# Patient Record
Sex: Male | Born: 1950 | Race: Black or African American | Hispanic: No | Marital: Married | Smoking: Former smoker
Health system: Southern US, Community
[De-identification: ages and names within clinical notes are randomized; demographics above are authoritative.]

## PROBLEM LIST (undated history)

## (undated) DIAGNOSIS — C220 Liver cell carcinoma: Secondary | ICD-10-CM

## (undated) DIAGNOSIS — E119 Type 2 diabetes mellitus without complications: Secondary | ICD-10-CM

## (undated) DIAGNOSIS — D509 Iron deficiency anemia, unspecified: Secondary | ICD-10-CM

## (undated) DIAGNOSIS — I10 Essential (primary) hypertension: Secondary | ICD-10-CM

## (undated) DIAGNOSIS — E7800 Pure hypercholesterolemia, unspecified: Secondary | ICD-10-CM

## (undated) DIAGNOSIS — D352 Benign neoplasm of pituitary gland: Secondary | ICD-10-CM

## (undated) HISTORY — PX: OTHER SURGICAL HISTORY: SHX169

---

## 2020-02-01 ENCOUNTER — Emergency Department
Admission: EM | Admit: 2020-02-01 | Discharge: 2020-02-01 | Disposition: A | Discharge status: Home / Self Care | Payer: Medicare Other | Attending: Emergency Medicine | Admitting: Emergency Medicine

## 2020-02-01 ENCOUNTER — Encounter: Payer: Self-pay | Admitting: Emergency Medicine

## 2020-02-01 ENCOUNTER — Other Ambulatory Visit: Payer: Self-pay

## 2020-02-01 DIAGNOSIS — Z111 Encounter for screening for respiratory tuberculosis: Secondary | ICD-10-CM | POA: Diagnosis not present

## 2020-02-01 NOTE — Discharge Instructions (Addendum)
PPD is negative F/u with your doctor as needed

## 2020-02-01 NOTE — ED Triage Notes (Signed)
Pt to ED via POV with c/o needing PPD read, states had PPD placed at [facility] acute care several days, unable to get it read at [facility] today.

## 2020-02-01 NOTE — ED Provider Notes (Signed)
[facility] Emergency Department Provider Note  ____________________________________________   Event Date/Time   First MD Initiated Contact with Patient 02/01/20 1214     (approximate)  I have reviewed the triage vital signs and the nursing notes.   HISTORY  Chief Complaint PPD Reading    HPI Chamberlain Steinborn is a 70 y.o. male presents emergency department in need of having his PPD read.  Patient states he had a PPD test done at [facility] clinic several days ago and was supposed to return today to have it read but they are closed due to the weather.    History reviewed. No pertinent past medical history.  There are no problems to display for this patient.   History reviewed. No pertinent surgical history.  Prior to Admission medications   Not on File    Allergies Patient has no known allergies.  No family history on file.  Social History Social History   Tobacco Use  . Smoking status: Never Smoker  . Smokeless tobacco: Never Used  Substance Use Topics  . Alcohol use: Not Currently  . Drug use: Not Currently    Review of Systems  Constitutional: No fever/chills Eyes: No visual changes. ENT: No sore throat. Respiratory: Denies cough Genitourinary: Negative for dysuria. Musculoskeletal: Negative for back pain. Skin: Negative for rash. Psychiatric: no mood changes,     ____________________________________________   PHYSICAL EXAM:  VITAL SIGNS: ED Triage Vitals  Enc Vitals Group     BP 02/01/20 1215 107/65     Pulse Rate 02/01/20 1215 78     Resp 02/01/20 1215 20     Temp 02/01/20 1215 98.4 F (36.9 C)     Temp Source 02/01/20 1215 Oral     SpO2 02/01/20 1215 98 %     Weight 02/01/20 1204 175 lb (79.4 kg)     Height 02/01/20 1204 5\' 7"  (1.702 m)     Head Circumference --      Peak Flow --      Pain Score 02/01/20 1204 0     Pain Loc --      Pain Edu? --      Excl. in GC? --     Constitutional: Alert and oriented.  Well appearing and in no acute distress. Eyes: Conjunctivae are normal.  Head: Atraumatic. Nose: No congestion/rhinnorhea. Mouth/Throat: Mucous membranes are moist.  Neck:  supple no lymphadenopathy noted Cardiovascular: Normal rate, regular rhythm.  Respiratory: Normal respiratory effort.  No retractions, GU: deferred Musculoskeletal: FROM all extremities, warm and well perfused Neurologic:  Normal speech and language.  Skin:  Skin is warm, dry and intact. No rash noted.  Area for PPD was placed is normal, no raised area and no induration noted Psychiatric: Mood and affect are normal. Speech and behavior are normal.  ____________________________________________   LABS (all labs ordered are listed, but only abnormal results are displayed)  Labs Reviewed - No data to display ____________________________________________   ____________________________________________  RADIOLOGY    ____________________________________________   PROCEDURES  Procedure(s) performed: No  Procedures    ____________________________________________   INITIAL IMPRESSION / ASSESSMENT AND PLAN / ED COURSE  Pertinent labs & imaging results that were available during my care of the patient were reviewed by me and considered in my medical decision making (see chart for details).   Patient 70 year old male presents emergency department in need of having his PPD read.  See HPI.  Physical exam is unremarkable.  PPD area is normal  Patient was  discharged stable condition with instructions to follow-up with his regular doctor.     Romelo Sciandra was evaluated in Emergency Department on 02/01/2020 for the symptoms described in the history of present illness. He was evaluated in the context of the global COVID-19 pandemic, which necessitated consideration that the patient might be at risk for infection with the SARS-CoV-2 virus that causes COVID-19. Institutional protocols and algorithms that pertain to  the evaluation of patients at risk for COVID-19 are in a state of rapid change based on information released by regulatory bodies including the CDC and federal and state organizations. These policies and algorithms were followed during the patient's care in the ED.    As part of my medical decision making, I reviewed the following data within the electronic MEDICAL RECORD NUMBER Nursing notes reviewed and incorporated, Old chart reviewed, Notes from prior ED visits and [place] Controlled Substance Database  ____________________________________________   FINAL CLINICAL IMPRESSION(S) / ED DIAGNOSES  Final diagnoses:  Encounter for PPD skin test reading      NEW MEDICATIONS STARTED DURING THIS VISIT:  There are no discharge medications for this patient.    Note:  This document was prepared using Dragon voice recognition software and may include unintentional dictation errors.    Faythe Ghee, PA-C 02/01/20 1255    Dionne Bucy, MD 02/01/20 1321

## 2020-09-02 ENCOUNTER — Other Ambulatory Visit: Payer: Self-pay

## 2020-09-02 ENCOUNTER — Emergency Department
Admission: EM | Admit: 2020-09-02 | Discharge: 2020-09-02 | Disposition: A | Discharge status: Home / Self Care | Payer: Medicare Other | Attending: Emergency Medicine | Admitting: Emergency Medicine

## 2020-09-02 ENCOUNTER — Encounter: Payer: Self-pay | Admitting: Emergency Medicine

## 2020-09-02 DIAGNOSIS — J069 Acute upper respiratory infection, unspecified: Secondary | ICD-10-CM | POA: Diagnosis not present

## 2020-09-02 DIAGNOSIS — U071 COVID-19: Secondary | ICD-10-CM | POA: Diagnosis not present

## 2020-09-02 DIAGNOSIS — I10 Essential (primary) hypertension: Secondary | ICD-10-CM | POA: Insufficient documentation

## 2020-09-02 DIAGNOSIS — R509 Fever, unspecified: Secondary | ICD-10-CM | POA: Diagnosis present

## 2020-09-02 HISTORY — DX: Essential (primary) hypertension: I10

## 2020-09-02 HISTORY — DX: Pure hypercholesterolemia, unspecified: E78.00

## 2020-09-02 LAB — RESP PANEL BY RT-PCR (FLU A&B, COVID) ARPGX2
Influenza A by PCR: NEGATIVE
Influenza B by PCR: NEGATIVE
SARS Coronavirus 2 by RT PCR: POSITIVE — AB

## 2020-09-02 MED ORDER — PREDNISONE 10 MG (21) PO TBPK: ORAL_TABLET | ORAL | 0 refills | Status: DC

## 2020-09-02 NOTE — ED Notes (Signed)
See triage note  Presents with fatigue  States he has felt this way for about 2-3 weeks    States he was exposed to someone with COVID at work  Afebrile on arrival  No pain but states has had diarrhea

## 2020-09-02 NOTE — Discharge Instructions (Addendum)
Follow your pending test results with [facility] My[facility].  Take prescription steroid as directed.  Take over-the-counter medication as needed for ongoing symptom relief.  Follow-up with your primary provider [facility] for continued symptoms.

## 2020-09-02 NOTE — ED Provider Notes (Signed)
[facility] Emergency Department Provider Note ____________________________________________  Time seen: 1229  I have reviewed the triage vital signs and the nursing notes.  HISTORY  Chief Complaint  Fatigue   HPI Aaron Lowery is a 70 y.o. male presents himself to the ED for evaluation of intermittent symptoms of fatigue, objective fevers, cough, congestion, diarrhea, loss of appetite, with onset 3 weeks prior.  Patient reports being Covid Vax + but no boosters. Flu vax + not presented to his primary provider or any other healthcare provider in the last 3 weeks for symptom evaluation.  Denies any chest pain, shortness of breath, loss of taste, or abdominal pain.  Past Medical History:  Diagnosis Date   Hypercholesteremia    Hypertension     There are no problems to display for this patient.   History reviewed. No pertinent surgical history.  Prior to Admission medications   Medication Sig Start Date End Date Taking? Authorizing Provider  predniSONE (STERAPRED UNI-PAK 21 TAB) 10 MG (21) TBPK tablet 6-day taper as directed. 09/02/20  Yes Jaliyah Fotheringham, Charlesetta Ivory, PA-C    Allergies Patient has no known allergies.  History reviewed. No pertinent family history.  Social History Social History   Tobacco Use   Smoking status: Never   Smokeless tobacco: Never  Substance Use Topics   Alcohol use: Not Currently   Drug use: Not Currently    Review of Systems  Constitutional: Negative for fever.  Reports generalized fatigue. Eyes: Negative for visual changes. ENT: Negative for sore throat. Cardiovascular: Negative for chest pain. Respiratory: Negative for shortness of breath. Gastrointestinal: Negative for abdominal pain, vomiting and diarrhea. Genitourinary: Negative for dysuria. Musculoskeletal: Negative for back pain. Skin: Negative for rash. Neurological: Negative for headaches, focal weakness or  numbness. ____________________________________________  PHYSICAL EXAM:  VITAL SIGNS: ED Triage Vitals  Enc Vitals Group     BP 09/02/20 1130 102/73     Pulse Rate 09/02/20 1130 78     Resp 09/02/20 1130 16     Temp 09/02/20 1130 98.6 F (37 C)     Temp Source 09/02/20 1130 Oral     SpO2 09/02/20 1130 95 %     Weight 09/02/20 1130 175 lb 0.7 oz (79.4 kg)     Height 09/02/20 1130 5\' 7"  (1.702 m)     Head Circumference --      Peak Flow --      Pain Score 09/02/20 1135 6     Pain Loc --      Pain Edu? --      Excl. in GC? --     Constitutional: Alert and oriented. Well appearing and in no distress. Head: Normocephalic and atraumatic. Eyes: Conjunctivae are normal. Normal extraocular movements Cardiovascular: Normal rate, regular rhythm. Normal distal pulses. Respiratory: Normal respiratory effort. No wheezes/rales/rhonchi. Gastrointestinal: Soft and nontender. No distention. Musculoskeletal: Nontender with normal range of motion in all extremities.  Neurologic:  Normal gait without ataxia. Normal speech and language. No gross focal neurologic deficits are appreciated. Skin:  Skin is warm, dry and intact. No rash noted. Psychiatric: Mood and affect are normal. Patient exhibits appropriate insight and judgment. ____________________________________________    {LABS (pertinent positives/negatives) Labs Reviewed  RESP PANEL BY RT-PCR (FLU A&B, COVID) ARPGX2  ____________________________________________  {EKG  ____________________________________________   RADIOLOGY Official radiology report(s): No results found. ____________________________________________  PROCEDURES   Procedures ____________________________________________   INITIAL IMPRESSION / ASSESSMENT AND PLAN / ED COURSE  As part of my medical decision making,  I reviewed the following data within the electronic MEDICAL RECORD NUMBER Labs reviewed pending and Notes from prior ED visits    DDX: viral URI,  Covid, influenza  Patient ED evaluation of symptoms including 3 weeks of intermittent fatigue, subjective fevers, congestion, and poor appetite.  Evaluate was complaints in the ED, found to be overall with reassuring stable vital signs without signs of acute respiratory distress or dehydration.  Patient is without toxic appearance on presentation.  He is agreeable to await COVID and flu testing results at this time.  He will follow with primary provider for ongoing symptoms.  The prescription for prednisone provided for his benefit.  He will follow-up as discussed  Aaron Lowery was evaluated in Emergency Department on 09/02/2020 for the symptoms described in the history of present illness. He was evaluated in the context of the global COVID-19 pandemic, which necessitated consideration that the patient might be at risk for infection with the SARS-CoV-2 virus that causes COVID-19. Institutional protocols and algorithms that pertain to the evaluation of patients at risk for COVID-19 are in a state of rapid change based on information released by regulatory bodies including the CDC and federal and state organizations. These policies and algorithms were followed during the patient's care in the ED. ____________________________________________  FINAL CLINICAL IMPRESSION(S) / ED DIAGNOSES  Final diagnoses:  Viral URI      Lissa Hoard, PA-C 09/02/20 1411    Arnaldo Natal, MD 09/02/20 [phone]

## 2020-09-02 NOTE — ED Triage Notes (Signed)
Pt reports that he has been feeling tired, he has been laying in bed and has only been eating breakfast. He has to force himself to get up. VSS. NAD. Pt is able to ambulate without difficulty. He reports that his job sent him to a place where someone had COVID and he wants to sue his employer because they didn't tell him and ever since than he has been feeling tired.

## 2021-02-24 ENCOUNTER — Emergency Department: Payer: Medicare Other

## 2021-02-24 ENCOUNTER — Emergency Department
Admission: EM | Admit: 2021-02-24 | Discharge: 2021-02-24 | Disposition: A | Discharge status: Home / Self Care | Payer: Medicare Other | Attending: Emergency Medicine | Admitting: Emergency Medicine

## 2021-02-24 ENCOUNTER — Other Ambulatory Visit: Payer: Self-pay

## 2021-02-24 ENCOUNTER — Encounter: Payer: Self-pay | Admitting: Emergency Medicine

## 2021-02-24 DIAGNOSIS — I10 Essential (primary) hypertension: Secondary | ICD-10-CM | POA: Diagnosis not present

## 2021-02-24 DIAGNOSIS — M79671 Pain in right foot: Secondary | ICD-10-CM | POA: Insufficient documentation

## 2021-02-24 DIAGNOSIS — R0602 Shortness of breath: Secondary | ICD-10-CM | POA: Insufficient documentation

## 2021-02-24 DIAGNOSIS — R5383 Other fatigue: Secondary | ICD-10-CM | POA: Insufficient documentation

## 2021-02-24 DIAGNOSIS — R531 Weakness: Secondary | ICD-10-CM | POA: Diagnosis not present

## 2021-02-24 DIAGNOSIS — M79672 Pain in left foot: Secondary | ICD-10-CM | POA: Diagnosis not present

## 2021-02-24 DIAGNOSIS — Z20822 Contact with and (suspected) exposure to covid-19: Secondary | ICD-10-CM | POA: Insufficient documentation

## 2021-02-24 LAB — BASIC METABOLIC PANEL
Anion gap: 5 (ref 5–15)
BUN: 19 mg/dL (ref 8–23)
CO2: 27 mmol/L (ref 22–32)
Calcium: 8.9 mg/dL (ref 8.9–10.3)
Chloride: 104 mmol/L (ref 98–111)
Creatinine, Ser: 1.21 mg/dL (ref 0.61–1.24)
GFR, Estimated: >60 mL/min (ref >60)
Glucose, Bld: 93 mg/dL (ref 70–99)
Potassium: 4.1 mmol/L (ref 3.5–5.1)
Sodium: 136 mmol/L (ref 135–145)

## 2021-02-24 LAB — RESP PANEL BY RT-PCR (FLU A&B, COVID) ARPGX2
Influenza A by PCR: NEGATIVE
Influenza B by PCR: NEGATIVE
SARS Coronavirus 2 by RT PCR: NEGATIVE

## 2021-02-24 LAB — HEPATIC FUNCTION PANEL
ALT: 16 U/L (ref 0–44)
AST: 20 U/L (ref 15–41)
Albumin: 3.9 g/dL (ref 3.5–5.0)
Alkaline Phosphatase: 41 U/L (ref 38–126)
Bilirubin, Direct: <0.1 mg/dL (ref 0.0–0.2)
Total Bilirubin: 0.5 mg/dL (ref 0.3–1.2)
Total Protein: 6.5 g/dL (ref 6.5–8.1)

## 2021-02-24 LAB — CBC
HCT: 35.9 % — ABNORMAL LOW (ref 39.0–52.0)
Hemoglobin: 11.3 g/dL — ABNORMAL LOW (ref 13.0–17.0)
MCH: 22.4 pg — ABNORMAL LOW (ref 26.0–34.0)
MCHC: 31.5 g/dL (ref 30.0–36.0)
MCV: 71.2 fL — ABNORMAL LOW (ref 80.0–100.0)
Platelets: 310 10*3/uL (ref 150–400)
RBC: 5.04 MIL/uL (ref 4.22–5.81)
RDW: 14.6 % (ref 11.5–15.5)
WBC: 7.4 10*3/uL (ref 4.0–10.5)
nRBC: 0 % (ref 0.0–0.2)

## 2021-02-24 LAB — TROPONIN I (HIGH SENSITIVITY)
Troponin I (High Sensitivity): 8 ng/L (ref <18)
Troponin I (High Sensitivity): 7 ng/L (ref <18)

## 2021-02-24 LAB — LIPASE, BLOOD: Lipase: 38 U/L (ref 11–51)

## 2021-02-24 LAB — D-DIMER, QUANTITATIVE: D-Dimer, Quant: 0.36 ug/mL-FEU (ref 0.00–0.50)

## 2021-02-24 NOTE — ED Notes (Signed)
Pt called for vital sign rechecked. No answer. Pt not seen in the ED.

## 2021-02-24 NOTE — ED Provider Notes (Signed)
[facility] Provider Note    Event Date/Time   First MD Initiated Contact with Patient 02/24/21 1917     (approximate)   History   Shortness of Breath, Weakness, and Foot Pain   HPI  Nicky Kras is a 71 y.o. male past medical history of hypertension hyperlipidemia presents with generalized malaise and shortness of breath.  Symptoms been going on for the past 3 to 4 days.  Endorses feeling very fatigued especially after eating.  Also endorses dyspnea on exertion which is new for him.  Has not had cough congestion fevers or chills.  Denies nausea vomiting abdominal pain.  Denies chest pain.  Has had some bilateral ankle pain but no swelling.  The patient denies hx of prior DVT/PE, unilateral leg pain/swelling, hormone use, recent surgery, hx of cancer, prolonged immobilization, or hemoptysis.      Past Medical History:  Diagnosis Date   Hypercholesteremia    Hypertension     There are no problems to display for this patient.    Physical Exam  Triage Vital Signs: ED Triage Vitals  Enc Vitals Group     BP 02/24/21 1704 (!) 162/123     Pulse Rate 02/24/21 1704 (!) 54     Resp 02/24/21 1704 20     Temp 02/24/21 1704 98.6 F (37 C)     Temp Source 02/24/21 1704 Oral     SpO2 02/24/21 1704 98 %     Weight 02/24/21 1706 179 lb 14.3 oz (81.6 kg)     Height 02/24/21 1706 5\' 7"  (1.702 m)     Head Circumference --      Peak Flow --      Pain Score 02/24/21 1704 7     Pain Loc --      Pain Edu? --      Excl. in GC? --     Most recent vital signs: Vitals:   02/24/21 1704 02/24/21 1911  BP: (!) 162/123 (!) 142/87  Pulse: (!) 54 64  Resp: 20 17  Temp: 98.6 F (37 C) 98.6 F (37 C)  SpO2: 98% 98%     General: Awake, no distress.  CV:  Good peripheral perfusion.  No asymmetry or lower extremity edema Resp:  Normal effort.  Lungs are clear Abd:  No distention.  Soft and nontender throughout Neuro:             Awake, Alert, Oriented x 3   Other:     ED Results / Procedures / Treatments  Labs (all labs ordered are listed, but only abnormal results are displayed) Labs Reviewed  CBC - Abnormal; Notable for the following components:      Result Value   Hemoglobin 11.3 (*)    HCT 35.9 (*)    MCV 71.2 (*)    MCH 22.4 (*)    All other components within normal limits  RESP PANEL BY RT-PCR (FLU A&B, COVID) ARPGX2  BASIC METABOLIC PANEL  HEPATIC FUNCTION PANEL  LIPASE, BLOOD  D-DIMER, QUANTITATIVE  TROPONIN I (HIGH SENSITIVITY)  TROPONIN I (HIGH SENSITIVITY)     EKG  EKG interpreted by myself, LVH, left axis deviation, sinus bradycardia, no acute ischemic changes   RADIOLOGY I reviewed the CXR which does not show any acute cardiopulmonary process; agree with radiology report     PROCEDURES:    MEDICATIONS ORDERED IN ED: Medications - No data to display   IMPRESSION / MDM / ASSESSMENT AND PLAN / ED COURSE  I reviewed the triage vital signs and the nursing notes.                              Differential diagnosis includes, but is not limited to, pneumonia, viral illness, CHF, pulmonary embolism, symptomatic anemia  Is a 71 year old male presenting with shortness of breath and generalized fatigue x4 days.  Vital signs are notable for borderline hypoxia, sat is between 93 and 98% on room air.  He otherwise appears well he does not appear volume overloaded lungs are clear abdomen is benign.  Chest x-ray is normal and his EKG aside showing LVH and a left axis is no acute ischemic changes.  Labs are reassuring CBC normal no anemia or white count CMP with normal electrolytes and troponin is negative.  Given the unexplained dyspnea with clear chest x-ray and borderline sats we will send a D-dimer to screen for PE.  Constellation of symptoms are somewhat suggestive of a viral illness we will send COVID and flu test as well.  Repeat troponin is negative as is D-dimer.  No obvious source of patient's dyspnea  identified.  I advised him to follow-up with his PCP if he has ongoing symptoms.  He is stable for discharge.  Clinical Course as of 02/24/21 2212  Tue Feb 24, 2021  2047 D-Dimer, Quant: 0.36 [KM]  2047 Troponin I (High Sensitivity): 7 [KM]    Clinical Course User Index [KM] Georga Hacking, MD     FINAL CLINICAL IMPRESSION(S) / ED DIAGNOSES   Final diagnoses:  Other fatigue  Shortness of breath     Rx / DC Orders   ED Discharge Orders     None        Note:  This document was prepared using Dragon voice recognition software and may include unintentional dictation errors.   Georga Hacking, MD 02/24/21 2212

## 2021-02-24 NOTE — ED Notes (Signed)
E signature pad not working. Pt educated on discharge instructions and verbalized understanding.  

## 2021-02-24 NOTE — ED Provider Triage Note (Signed)
Emergency Medicine Provider Triage Evaluation Note  Aaron Lowery , a 71 y.o. male  was evaluated in triage.  presents to the emergency department with shortness of breath for the past 3 weeks.  Patient denies chest pain or chest tightness.  No pain in the upper back.  No cough.  Endorses generalized malaise at home and increased sleeping.  Denies experiencing similar symptoms in the past.  Review of Systems  Positive: Patient has shortness of breath. Negative:   Physical Exam  BP (!) 162/123 (BP Location: Right Arm)    Pulse (!) 54    Temp 98.6 F (37 C) (Oral)    Resp 20    Ht 5\' 7"  (1.702 m)    Wt 81.6 kg    SpO2 98%    BMI 28.18 kg/m  Gen:   Awake, no distress   Resp:  Normal effort  MSK:   Moves extremities without difficulty  Other:    Medical Decision Making  Medically screening exam initiated at 5:14 PM.  Appropriate orders placed.  Easten Maceachern was informed that the remainder of the evaluation will be completed by another provider, this initial triage assessment does not replace that evaluation, and the importance of remaining in the ED until their evaluation is complete.     Linton Rump [place], [place] 02/24/21 1714

## 2021-02-24 NOTE — ED Triage Notes (Signed)
Pt reports that he developed SHOB, weakness and bilat foot pain three days ago. He is able to speak in complete sentences. He feels like all he wants to do is rest and sleep and this is not normal for him.

## 2021-02-24 NOTE — Discharge Instructions (Signed)
Your blood work including your cardiac enzymes and the screen for blood clots was all reassuring.  Your chest x-ray does not show pneumonia.  You may have a viral infection was causing your symptoms.  Please follow-up with your primary care physician if you continue to have symptoms.

## 2021-09-09 ENCOUNTER — Other Ambulatory Visit: Payer: Self-pay

## 2021-09-09 ENCOUNTER — Emergency Department
Admission: EM | Admit: 2021-09-09 | Discharge: 2021-09-09 | Disposition: A | Discharge status: Home / Self Care | Payer: Medicare Other | Attending: Emergency Medicine | Admitting: Emergency Medicine

## 2021-09-09 ENCOUNTER — Emergency Department: Payer: Medicare Other

## 2021-09-09 DIAGNOSIS — E119 Type 2 diabetes mellitus without complications: Secondary | ICD-10-CM | POA: Insufficient documentation

## 2021-09-09 DIAGNOSIS — I10 Essential (primary) hypertension: Secondary | ICD-10-CM | POA: Diagnosis not present

## 2021-09-09 DIAGNOSIS — H402210 Chronic angle-closure glaucoma, right eye, stage unspecified: Secondary | ICD-10-CM | POA: Diagnosis not present

## 2021-09-09 DIAGNOSIS — R42 Dizziness and giddiness: Secondary | ICD-10-CM | POA: Diagnosis not present

## 2021-09-09 DIAGNOSIS — H5711 Ocular pain, right eye: Secondary | ICD-10-CM | POA: Diagnosis present

## 2021-09-09 LAB — BASIC METABOLIC PANEL
Anion gap: 5 (ref 5–15)
BUN: 16 mg/dL (ref 8–23)
CO2: 25 mmol/L (ref 22–32)
Calcium: 9.5 mg/dL (ref 8.9–10.3)
Chloride: 109 mmol/L (ref 98–111)
Creatinine, Ser: 1.14 mg/dL (ref 0.61–1.24)
GFR, Estimated: >60 mL/min (ref >60)
Glucose, Bld: 144 mg/dL — ABNORMAL HIGH (ref 70–99)
Potassium: 3.6 mmol/L (ref 3.5–5.1)
Sodium: 139 mmol/L (ref 135–145)

## 2021-09-09 LAB — URINALYSIS, ROUTINE W REFLEX MICROSCOPIC
Bilirubin Urine: NEGATIVE
Glucose, UA: NEGATIVE mg/dL
Hgb urine dipstick: NEGATIVE
Ketones, ur: NEGATIVE mg/dL
Leukocytes,Ua: NEGATIVE
Nitrite: NEGATIVE
Protein, ur: NEGATIVE mg/dL
Specific Gravity, Urine: 1.021 (ref 1.005–1.030)
pH: 5 (ref 5.0–8.0)

## 2021-09-09 LAB — CBC
HCT: 35.4 % — ABNORMAL LOW (ref 39.0–52.0)
Hemoglobin: 11.3 g/dL — ABNORMAL LOW (ref 13.0–17.0)
MCH: 22.3 pg — ABNORMAL LOW (ref 26.0–34.0)
MCHC: 31.9 g/dL (ref 30.0–36.0)
MCV: 69.8 fL — ABNORMAL LOW (ref 80.0–100.0)
Platelets: 290 10*3/uL (ref 150–400)
RBC: 5.07 MIL/uL (ref 4.22–5.81)
RDW: 14.9 % (ref 11.5–15.5)
WBC: 6.5 10*3/uL (ref 4.0–10.5)
nRBC: 0 % (ref 0.0–0.2)

## 2021-09-09 LAB — CBG MONITORING, ED: Glucose-Capillary: 161 mg/dL — ABNORMAL HIGH (ref 70–99)

## 2021-09-09 NOTE — ED Notes (Signed)
AVS being prepared by EDP.

## 2021-09-09 NOTE — ED Notes (Signed)
Pt to ED via POV c/o vision loss in his right eye x 5 days. Pt states that today, about 1 hour PTA he started to have some dizziness. Pt denies numbness or weakness, states that he just feels tired. No slurred speech or facial droop noted at this time. Pt is able to ambulate without assistance or difficulty.   Pt does not have personal hx/o CVA.  Spoke with Dr. Scotty Court about pt's symptoms, does not meet code stroke criteria since vision loss has been on going for 5 days.

## 2021-09-09 NOTE — Consult Note (Signed)
Reason for Consult:blurry vision right eye Referring Physician: Sharyn Creamer, [facility] ED MD. Chief complaint: blurry vision  HPI: Aaron Lowery is an 71 y.o. male with past ocular history of glaucoma, with a history of glaucoma surgery in the left eye and cataract surgery in both eyes. He reports decreased vision in the right eye for the past few days "I can't see at all."   He says that he has had eye pain, aching pain in the right eye. He reports that he stopped taking his glaucoma eye drops yesterday because they were making his vision blurry in the morning when he was putting them in. He has been taking the drops for about 9 months.   He reports the highest his eye pressure has ever been was greater than 30, but dropped to about the teens after starting eye drops.   He has an appointment next week with Dr. Tawanna Cooler, [facility].  Past Medical History:  Diagnosis Date   Hypercholesteremia    Hypertension     ROS  No past surgical history on file. Past ocular surgery pertinent for   No family history on file.  Social History:  reports that he has never smoked. He has never used smokeless tobacco. He reports that he does not currently use alcohol. He reports that he does not currently use drugs.  Allergies: No Known Allergies  Prior to Admission medications   Medication Sig Start Date End Date Taking? Authorizing Provider  predniSONE (STERAPRED UNI-PAK 21 TAB) 10 MG (21) TBPK tablet 6-day taper as directed. 09/02/20   Menshew, Charlesetta Ivory, PA-C    Results for orders placed or performed during the hospital encounter of 09/09/21 (from the past 48 hour(s))  CBG monitoring, ED     Status: Abnormal   Collection Time: 09/09/21  2:05 PM  Result Value Ref Range   Glucose-Capillary 161 (H) 70 - 99 mg/dL    Comment: Glucose reference range applies only to samples taken after fasting for at least 8 hours.  Basic metabolic panel     Status: Abnormal   Collection Time: 09/09/21  2:06 PM   Result Value Ref Range   Sodium 139 135 - 145 mmol/L   Potassium 3.6 3.5 - 5.1 mmol/L   Chloride 109 98 - 111 mmol/L   CO2 25 22 - 32 mmol/L   Glucose, Bld 144 (H) 70 - 99 mg/dL    Comment: Glucose reference range applies only to samples taken after fasting for at least 8 hours.   BUN 16 8 - 23 mg/dL   Creatinine, Ser 1.44 0.61 - 1.24 mg/dL   Calcium 9.5 8.9 - 81.8 mg/dL   GFR, Estimated >56 >31 mL/min    Comment: (NOTE) Calculated using the CKD-EPI Creatinine Equation (2021)    Anion gap 5 5 - 15    Comment: Performed at [facility], [address] [place], [place] [address]  CBC     Status: Abnormal   Collection Time: 09/09/21  2:06 PM  Result Value Ref Range   WBC 6.5 4.0 - 10.5 K/uL   RBC 5.07 4.22 - 5.81 MIL/uL   Hemoglobin 11.3 (L) 13.0 - 17.0 g/dL   HCT 63.7 (L) 85.8 - 85.0 %   MCV 69.8 (L) 80.0 - 100.0 fL   MCH 22.3 (L) 26.0 - 34.0 pg   MCHC 31.9 30.0 - 36.0 g/dL   RDW 27.7 41.2 - 87.8 %   Platelets 290 150 - 400 K/uL   nRBC 0.0  0.0 - 0.2 %    Comment: Performed at [facility], [address] [place] [place], [place] [address]    CT Head Wo Contrast  Result Date: 09/09/2021 CLINICAL DATA:  Provided history: Dizziness, persistent/recurrent, cardiac or vascular cause suspected. Additional history provided: Right eye vision loss for 3 days. EXAM: CT HEAD WITHOUT CONTRAST TECHNIQUE: Contiguous axial images were obtained from the base of the skull through the vertex without intravenous contrast. RADIATION DOSE REDUCTION: This exam was performed according to the departmental dose-optimization program which includes automated exposure control, adjustment of the mA and/or kV according to patient size and/or use of iterative reconstruction technique. COMPARISON:  No pertinent prior exams available for comparison. FINDINGS: Brain: Mild generalized cerebral atrophy. Apparent sellar mass with suprasellar extension, measuring 1.9 x 1.4 x 2.0 cm (for instance as seen on  series 5, image 26). Associated bony remodeling and expansion of the sella turcica. Mild patchy and ill-defined hypoattenuation within the cerebral white matter, nonspecific but compatible with chronic small vessel ischemic disease. There is no acute intracranial hemorrhage. No demarcated cortical infarct. No extra-axial fluid collection. No midline shift. Vascular: No hyperdense vessel. Skull: No fracture or aggressive osseous lesion. Burr hole within the left frontal calvarium. Small left temporoparietal craniectomy. Sinuses/Orbits: No orbital mass or acute orbital finding. Mild mucosal thickening within the inferior left maxillary sinus at the imaged levels. Impression #1 called by telephone at the time of interpretation on 09/09/2021 at 2:45 pm to provider NP Triplet, who verbally acknowledged these results. IMPRESSION: Sellar mass with suprasellar extension, measuring 1.9 x 1.4 x 2.0 cm. Associated bony remodeling and expansion of the sella turcica. A pituitary protocol brain MRI without and with contrast is recommended for further characterization, and to assess for any mass effect upon the optic apparatus. A burr hole is present within the left frontal calvarium. Additionally, a small craniectomy is present within the left temporoparietal calvarium. Correlate with the procedural history. Mild chronic small vessel image changes within the cerebral white matter. Mild generalized cerebral atrophy. Mild left maxillary sinus mucosal thickening at the imaged levels. Electronically Signed   By: Jackey Loge D.O.   On: 09/09/2021 14:50    Blood pressure (!) 161/99, pulse (!) 56, temperature (!) 97.4 F (36.3 C), temperature source Oral, resp. rate 17, height 5\' 7"  (1.702 m), weight 81.6 kg, SpO2 97 %.  Mental status: Alert and Oriented x 4  Visual Acuity:  20/40- (on second attempt) OD  20/30+ near [place]  Pupils:  irregular on the left, surgical iris, poorly reactive to light ou,   No Afferent  defect.  Motility:  Full/ orthophoric  Visual Fields:  counts fingers in all 4 quadrants in the left eye, depressed peripheral vision, especially nasally in the right.   IOP:  44 in the right, 22 in the left by Goldman,  Combigan drops placed 6:00 pm. Improved to 32 in the right on re-check.  External/ Lids/ Lashes:  Normal  Anterior Segment:  Conjunctiva:  Bleb superiorly OS, wnl OD.  Cornea:  Mostly clear, trace microcystic edema OD.   Anterior Chamber: Normal  OD, few synechia OS.  Lens:   IOL OU, pigment on IOL OS.  Posterior Segment: not dilated.  Clear vitreous.  C:D 0.9 ou.  Limited view of macula flat.    Assessment/Plan:  Advanced glaucoma OU, Poorly controlled OD, nonadherence to topical glaucoma therapy because of side effect of blurriness, eye discomfort.    Responds to topical therapy, needs close followup.  Restart existing topical meds. Followup tomorrow [facility] to recheck IOP, Dr. Brooke Dare 11:00 am. IOLs OU. Pit. 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    Willey Blade 09/09/2021, 5:42 PM

## 2021-09-09 NOTE — ED Notes (Signed)
Pt being seen by opthalmologist in eye examination room.

## 2021-09-09 NOTE — ED Provider Triage Note (Signed)
  Emergency Medicine Provider Triage Evaluation Note  Aaron Lowery , a 71 y.o.male,  was evaluated in triage.  Pt complains of weakness/burred vision x3 days.  He states that he currently takes eyedrops for glaucoma/cataract.  Reports having emergency well out of his right eye.  Additionally has been experiencing some dizziness that started approximately 1 hour ago.   Review of Systems  Positive: Weakness, dizziness, blurred vision Negative: Denies fever, chest pain, vomiting  Physical Exam   Vitals:   09/09/21 1403  BP: (!) 152/103  Pulse: 77  Resp: 18  Temp: (!) 97.4 F (36.3 C)  SpO2: 93%   Gen:   Awake, no distress   Resp:  Normal effort  MSK:   Moves extremities without difficulty  Other:  No facial droop, no gross neurological deficits.  No pronator drift.  Medical Decision Making  Given the patient's initial medical screening exam, the following diagnostic evaluation has been ordered. The patient will be placed in the appropriate treatment space, once one is available, to complete the evaluation and treatment. I have discussed the plan of care with the patient and I have advised the patient that an ED physician or mid-level practitioner will reevaluate their condition after the test results have been received, as the results may give them additional insight into the type of treatment they may need.    Diagnostics: Labs, head CT  Treatments: none immediately   Varney Daily, [place] 09/09/21 1410

## 2021-09-09 NOTE — ED Notes (Signed)
Pt to ED see triage note, states not only eye pain and blurry vision and weakness for 3-4 days, but also today at about 1230pm pt was walking and suddenly felt dizzy and like was going to faint and had to hold onto wall. Also experiencing intermittent SOB since several months.

## 2021-09-09 NOTE — ED Provider Notes (Signed)
[facility] Provider Note    Event Date/Time   First MD Initiated Contact with Patient 09/09/21 1526     (approximate)   History   Blurred Vision and Dizziness   HPI  Aaron Lowery is a 71 y.o. male with a history of diabetes and pituitary mass as well as he reports history of glaucoma and possible cataract surgeries in the past  For 3 days is noticed he started having a progressive blurriness of vision in his right eye and pain in his right eye.  He comes now as he reports he still able to see but it is very hard to see out of the right eye  No fevers or chills no headaches.  No chest pain.  Ports he is not having a headache as it feels like his right eye is very blurry.  He uses drops for glaucoma     Physical Exam   Triage Vital Signs: ED Triage Vitals  Enc Vitals Group     BP 09/09/21 1403 (!) 152/103     Pulse Rate 09/09/21 1403 77     Resp 09/09/21 1403 18     Temp 09/09/21 1403 (!) 97.4 F (36.3 C)     Temp Source 09/09/21 1403 Oral     SpO2 09/09/21 1403 93 %     Weight 09/09/21 1405 179 lb 14.3 oz (81.6 kg)     Height 09/09/21 1405 5\' 7"  (1.702 m)     Head Circumference --      Peak Flow --      Pain Score 09/09/21 1404 0     Pain Loc --      Pain Edu? --      Excl. in GC? --     Most recent vital signs: Vitals:   09/09/21 1700 09/09/21 1730  BP: (!) 187/104   Pulse: 61 65  Resp: 16   Temp:    SpO2: 96% 99%     General: Awake, no distress.  Diminished visual acuity over the right eye through all fields.  Left eye he reports clear vision to digital recognition. Excellent intraocular pressure in the right eye by Tono-Pen reading 45-55.  Left eye 16-18. CV:  Good peripheral perfusion.  Resp:  Normal effort.  Abd:  No distention.  Other:  Normal cranial nerve exam with the exception to decreased diminished sensation over the right.  Moves all extremities well.  No obvious focal deficits.   ED Results / Procedures /  Treatments   Labs (all labs ordered are listed, but only abnormal results are displayed) Labs Reviewed  BASIC METABOLIC PANEL - Abnormal; Notable for the following components:      Result Value   Glucose, Bld 144 (*)    All other components within normal limits  CBC - Abnormal; Notable for the following components:   Hemoglobin 11.3 (*)    HCT 35.4 (*)    MCV 69.8 (*)    MCH 22.3 (*)    All other components within normal limits  URINALYSIS, ROUTINE W REFLEX MICROSCOPIC - Abnormal; Notable for the following components:   Color, Urine YELLOW (*)    APPearance CLEAR (*)    All other components within normal limits  CBG MONITORING, ED - Abnormal; Notable for the following components:   Glucose-Capillary 161 (*)    All other components within normal limits     EKG  And interpreted by me at 1410 heart rate 75 QRS 90 QTc 410 normal  sinus rhythm called mild nonspecific T wave abnormality V2 and V3.  No STEMI   RADIOLOGY  CT of the head reviewed by me negative for obvious gross intracranial hemorrhage.  Further radiologist for additional read as there appears to be some sort of a mass in the sellar region  CT Head Wo Contrast  Result Date: 09/09/2021 CLINICAL DATA:  Provided history: Dizziness, persistent/recurrent, cardiac or vascular cause suspected. Additional history provided: Right eye vision loss for 3 days. EXAM: CT HEAD WITHOUT CONTRAST TECHNIQUE: Contiguous axial images were obtained from the base of the skull through the vertex without intravenous contrast. RADIATION DOSE REDUCTION: This exam was performed according to the departmental dose-optimization program which includes automated exposure control, adjustment of the mA and/or kV according to patient size and/or use of iterative reconstruction technique. COMPARISON:  No pertinent prior exams available for comparison. FINDINGS: Brain: Mild generalized cerebral atrophy. Apparent sellar mass with suprasellar extension, measuring  1.9 x 1.4 x 2.0 cm (for instance as seen on series 5, image 26). Associated bony remodeling and expansion of the sella turcica. Mild patchy and ill-defined hypoattenuation within the cerebral white matter, nonspecific but compatible with chronic small vessel ischemic disease. There is no acute intracranial hemorrhage. No demarcated cortical infarct. No extra-axial fluid collection. No midline shift. Vascular: No hyperdense vessel. Skull: No fracture or aggressive osseous lesion. Burr hole within the left frontal calvarium. Small left temporoparietal craniectomy. Sinuses/Orbits: No orbital mass or acute orbital finding. Mild mucosal thickening within the inferior left maxillary sinus at the imaged levels. Impression #1 called by telephone at the time of interpretation on 09/09/2021 at 2:45 pm to provider NP Triplet, who verbally acknowledged these results. IMPRESSION: Sellar mass with suprasellar extension, measuring 1.9 x 1.4 x 2.0 cm. Associated bony remodeling and expansion of the sella turcica. A pituitary protocol brain MRI without and with contrast is recommended for further characterization, and to assess for any mass effect upon the optic apparatus. A burr hole is present within the left frontal calvarium. Additionally, a small craniectomy is present within the left temporoparietal calvarium. Correlate with the procedural history. Mild chronic small vessel image changes within the cerebral white matter. Mild generalized cerebral atrophy. Mild left maxillary sinus mucosal thickening at the imaged levels. Electronically Signed   By: Jackey Loge D.O.   On: 09/09/2021 14:50      PROCEDURES:  Critical Care performed: Yes, see critical care procedure note(s)  CRITICAL CARE Performed by: Sharyn Creamer   Total critical care time: 35 minutes  Critical care time was exclusive of separately billable procedures and treating other patients.  Critical care was necessary to treat or prevent imminent or  life-threatening deterioration.  Critical care was time spent personally by me on the following activities: development of treatment plan with patient and/or surrogate as well as nursing, discussions with consultants, evaluation of patient's response to treatment, examination of patient, obtaining history from patient or surrogate, ordering and performing treatments and interventions, ordering and review of laboratory studies, ordering and review of radiographic studies, pulse oximetry and re-evaluation of patient's condition.   Procedures   MEDICATIONS ORDERED IN ED: Medications - No data to display   IMPRESSION / MDM / ASSESSMENT AND PLAN / ED COURSE  I reviewed the triage vital signs and the nursing notes.                              Differential diagnosis includes, but is  not limited to, possible optic chiasm compression, acute or subacute glaucoma, ophthalmologic lesion, stroke, or other acute ophthalmologic or neurologic condition.  Based on my initial assessment and elevated intraocular pressure in the right I am quite concerned this may be ophthalmologic representative of possible glaucoma.  I discussed with our ophthalmologist who has provided and is providing urgent consultation  Patient's presentation is most consistent with acute presentation with potential threat to life or bodily function.     Clinical Course as of 09/09/21 1836  Wed Sep 09, 2021  1552 Dr. Brooke Dare (eye) coming to see and consult re: R vision change and eye pain. ? Acute glaucoma [MQ]    Clinical Course User Index [MQ] Sharyn Creamer, MD   Labs interpreted as very mild anemia.  Normal metabolic panel  ----------------------------------------- 6:35 PM on 09/09/2021 ----------------------------------------- Dr. Brooke Dare from ophthalmology discussed the case with me.  He advised he is going to have the patient continue his prescribed outpatient eyedrops, and Dr. Brooke Dare also placed drops here and patient has  evidence of ongoing glaucoma.  He advised he will have a follow-up appointment tomorrow with him at 11 AM.  Discussed with the patient patient affirms that he will adhere to his prescription eyedrops for glaucoma, and is going to see Dr. Brooke Dare at 11 AM at [facility] eye in [place].  Additionally, discussed his blood pressure which is notably elevating now, patient reports he typically takes blood pressure medication in the evening.  I offered that we would be able to sleep give him his prescription monitor to make sure his blood pressure comes down and is quite elevated at this time, the patient declines this he wishes to go home take his medication and follow-up tomorrow.  I think this is also reasonable.  He denies headache.  He does not have any bilateral vision changes, no chest pain no obvious signs or symptoms suggestive of hypertensive emergency.  He is however suffering from glaucoma, particularly in the right eye and has arranged to continue his drops, has been seen by her ophthalmologist here, and has appointment tomorrow at 11 AM  Return precautions and treatment recommendations and follow-up discussed with the patient who is agreeable with the plan.   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   Sharyn Creamer, MD 09/09/21 [phone]

## 2021-09-09 NOTE — ED Triage Notes (Signed)
Pt here with blurred vision and dizziness. Pt states he has not been able to see out his right eye for 3 days. Pt states he then had some dizziness that started an hour ago today. Pt also states he is weak.

## 2021-09-09 NOTE — Discharge Instructions (Addendum)
Please take your prescribed blood pressure medication when you get home.  Also continue your prescribed eyedrops for glaucoma.  It is imperative, extremely important that you follow-up tomorrow with Dr. Brooke Dare at the eye clinic at 11 AM.

## 2022-07-14 ENCOUNTER — Ambulatory Visit
Admission: RE | Admit: 2022-07-14 | Discharge: 2022-07-14 | Disposition: A | Discharge status: Home / Self Care | Payer: 59 | Source: Ambulatory Visit | Attending: Internal Medicine | Admitting: Internal Medicine

## 2022-07-14 ENCOUNTER — Other Ambulatory Visit: Payer: Self-pay | Admitting: Internal Medicine

## 2022-07-14 DIAGNOSIS — R0602 Shortness of breath: Secondary | ICD-10-CM

## 2022-07-21 ENCOUNTER — Other Ambulatory Visit: Payer: Self-pay | Admitting: Internal Medicine

## 2022-07-21 DIAGNOSIS — R911 Solitary pulmonary nodule: Secondary | ICD-10-CM

## 2022-07-21 DIAGNOSIS — R0602 Shortness of breath: Secondary | ICD-10-CM

## 2022-07-23 ENCOUNTER — Ambulatory Visit
Admission: RE | Admit: 2022-07-23 | Discharge: 2022-07-23 | Disposition: A | Discharge status: Home / Self Care | Payer: 59 | Source: Ambulatory Visit | Attending: Internal Medicine | Admitting: Internal Medicine

## 2022-07-23 ENCOUNTER — Ambulatory Visit: Admission: RE | Admit: 2022-07-23 | Payer: 59 | Source: Ambulatory Visit

## 2022-07-23 DIAGNOSIS — R0602 Shortness of breath: Secondary | ICD-10-CM | POA: Insufficient documentation

## 2022-07-23 DIAGNOSIS — R911 Solitary pulmonary nodule: Secondary | ICD-10-CM | POA: Insufficient documentation

## 2022-08-29 ENCOUNTER — Emergency Department: Payer: 59

## 2022-08-29 ENCOUNTER — Emergency Department
Admission: EM | Admit: 2022-08-29 | Discharge: 2022-08-30 | Discharge status: Against Medical Advice | Payer: 59 | Attending: Emergency Medicine | Admitting: Emergency Medicine

## 2022-08-29 ENCOUNTER — Other Ambulatory Visit: Payer: Self-pay

## 2022-08-29 DIAGNOSIS — Z5321 Procedure and treatment not carried out due to patient leaving prior to being seen by health care provider: Secondary | ICD-10-CM | POA: Insufficient documentation

## 2022-08-29 DIAGNOSIS — R0602 Shortness of breath: Secondary | ICD-10-CM | POA: Insufficient documentation

## 2022-08-29 LAB — BASIC METABOLIC PANEL
Anion gap: 13 (ref 5–15)
BUN: 13 mg/dL (ref 8–23)
CO2: 21 mmol/L — ABNORMAL LOW (ref 22–32)
Calcium: 9.2 mg/dL (ref 8.9–10.3)
Chloride: 104 mmol/L (ref 98–111)
Creatinine, Ser: 1.06 mg/dL (ref 0.61–1.24)
GFR, Estimated: >60 mL/min (ref >60)
Glucose, Bld: 129 mg/dL — ABNORMAL HIGH (ref 70–99)
Potassium: 3.3 mmol/L — ABNORMAL LOW (ref 3.5–5.1)
Sodium: 138 mmol/L (ref 135–145)

## 2022-08-29 LAB — TROPONIN I (HIGH SENSITIVITY): Troponin I (High Sensitivity): 8 ng/L (ref <18)

## 2022-08-29 LAB — CBC
HCT: 36.3 % — ABNORMAL LOW (ref 39.0–52.0)
Hemoglobin: 11.4 g/dL — ABNORMAL LOW (ref 13.0–17.0)
MCH: 22.2 pg — ABNORMAL LOW (ref 26.0–34.0)
MCHC: 31.4 g/dL (ref 30.0–36.0)
MCV: 70.8 fL — ABNORMAL LOW (ref 80.0–100.0)
Platelets: 353 10*3/uL (ref 150–400)
RBC: 5.13 MIL/uL (ref 4.22–5.81)
RDW: 15.3 % (ref 11.5–15.5)
WBC: 7.6 10*3/uL (ref 4.0–10.5)
nRBC: 0 % (ref 0.0–0.2)

## 2022-08-29 NOTE — ED Triage Notes (Signed)
Pt sts that he has been SOB for the last week. Pt sts that he has been taking an inhaler with no progress.

## 2022-09-01 ENCOUNTER — Encounter: Payer: Self-pay | Admitting: Pediatric Physical Medicine and Rehabilitation

## 2022-09-01 DIAGNOSIS — J449 Chronic obstructive pulmonary disease, unspecified: Secondary | ICD-10-CM

## 2022-09-02 ENCOUNTER — Other Ambulatory Visit: Payer: Self-pay | Admitting: Internal Medicine

## 2022-09-02 ENCOUNTER — Encounter: Payer: Self-pay | Admitting: Internal Medicine

## 2022-09-02 DIAGNOSIS — J449 Chronic obstructive pulmonary disease, unspecified: Secondary | ICD-10-CM

## 2022-09-24 ENCOUNTER — Emergency Department: Payer: 59

## 2022-09-24 ENCOUNTER — Emergency Department
Admission: EM | Admit: 2022-09-24 | Discharge: 2022-09-24 | Disposition: A | Discharge status: Home / Self Care | Payer: 59 | Attending: Emergency Medicine | Admitting: Emergency Medicine

## 2022-09-24 ENCOUNTER — Other Ambulatory Visit: Payer: Self-pay

## 2022-09-24 DIAGNOSIS — Z20822 Contact with and (suspected) exposure to covid-19: Secondary | ICD-10-CM | POA: Diagnosis not present

## 2022-09-24 DIAGNOSIS — R5383 Other fatigue: Secondary | ICD-10-CM | POA: Diagnosis not present

## 2022-09-24 DIAGNOSIS — R110 Nausea: Secondary | ICD-10-CM | POA: Diagnosis present

## 2022-09-24 DIAGNOSIS — R0602 Shortness of breath: Secondary | ICD-10-CM | POA: Insufficient documentation

## 2022-09-24 DIAGNOSIS — I10 Essential (primary) hypertension: Secondary | ICD-10-CM | POA: Insufficient documentation

## 2022-09-24 DIAGNOSIS — R634 Abnormal weight loss: Secondary | ICD-10-CM | POA: Insufficient documentation

## 2022-09-24 LAB — CBC
HCT: 37.2 % — ABNORMAL LOW (ref 39.0–52.0)
Hemoglobin: 12 g/dL — ABNORMAL LOW (ref 13.0–17.0)
MCH: 22.2 pg — ABNORMAL LOW (ref 26.0–34.0)
MCHC: 32.3 g/dL (ref 30.0–36.0)
MCV: 68.9 fL — ABNORMAL LOW (ref 80.0–100.0)
Platelets: 337 10*3/uL (ref 150–400)
RBC: 5.4 MIL/uL (ref 4.22–5.81)
RDW: 15.2 % (ref 11.5–15.5)
WBC: 7 10*3/uL (ref 4.0–10.5)
nRBC: 0 % (ref 0.0–0.2)

## 2022-09-24 LAB — BASIC METABOLIC PANEL
Anion gap: 9 (ref 5–15)
BUN: 12 mg/dL (ref 8–23)
CO2: 23 mmol/L (ref 22–32)
Calcium: 9.2 mg/dL (ref 8.9–10.3)
Chloride: 104 mmol/L (ref 98–111)
Creatinine, Ser: 1.03 mg/dL (ref 0.61–1.24)
GFR, Estimated: >60 mL/min (ref >60)
Glucose, Bld: 148 mg/dL — ABNORMAL HIGH (ref 70–99)
Potassium: 3.7 mmol/L (ref 3.5–5.1)
Sodium: 136 mmol/L (ref 135–145)

## 2022-09-24 LAB — TROPONIN I (HIGH SENSITIVITY): Troponin I (High Sensitivity): 6 ng/L (ref <18)

## 2022-09-24 LAB — HEPATIC FUNCTION PANEL
ALT: 17 U/L (ref 0–44)
AST: 24 U/L (ref 15–41)
Albumin: 4.1 g/dL (ref 3.5–5.0)
Alkaline Phosphatase: 61 U/L (ref 38–126)
Bilirubin, Direct: 0.1 mg/dL (ref 0.0–0.2)
Indirect Bilirubin: 0.6 mg/dL (ref 0.3–0.9)
Total Bilirubin: 0.7 mg/dL (ref 0.3–1.2)
Total Protein: 7.8 g/dL (ref 6.5–8.1)

## 2022-09-24 LAB — SARS CORONAVIRUS 2 BY RT PCR: SARS Coronavirus 2 by RT PCR: NEGATIVE

## 2022-09-24 MED ORDER — METOCLOPRAMIDE HCL 10 MG PO TABS: 10.0000 mg | ORAL_TABLET | Freq: Three times a day (TID) | ORAL | 0 refills | Status: DC | PRN

## 2022-09-24 MED ORDER — IOHEXOL 300 MG/ML  SOLN
100.0000 mL | Freq: Once | INTRAMUSCULAR | Status: AC | PRN
  Administered 2022-09-24: 100 mL via INTRAVENOUS

## 2022-09-24 NOTE — ED Provider Notes (Signed)
[facility] Provider Note    Event Date/Time   First MD Initiated Contact with Patient 09/24/22 1650     (approximate)  History   Chief Complaint: Chest Pain  HPI  Aaron Lowery is a 72 y.o. male with a past medical history of hypertension, hyperlipidemia presents to the emergency department with various symptoms.  According to the patient over the past several weeks he has been experiencing weakness and fatigue having to take naps at times.  He states he is also lost about 5 pounds.  States at times he feels somewhat nauseated almost like he is short of breath but denies any chest pain.  States his symptoms seem to occur mostly after eating but denies any pain in the abdomen but will be nauseated at times.  Denies any constipation or significant diarrhea.  No fever.  No urinary symptoms.  Physical Exam   Triage Vital Signs: ED Triage Vitals  Encounter Vitals Group     BP 09/24/22 1309 (!) 145/90     Systolic BP Percentile --      Diastolic BP Percentile --      Pulse Rate 09/24/22 1309 78     Resp 09/24/22 1309 17     Temp 09/24/22 1309 98.3 F (36.8 C)     Temp Source 09/24/22 1309 Oral     SpO2 09/24/22 1309 96 %     Weight 09/24/22 1310 184 lb 15.5 oz (83.9 kg)     Height 09/24/22 1310 5\' 7"  (1.702 m)     Head Circumference --      Peak Flow --      Pain Score 09/24/22 1310 3     Pain Loc --      Pain Education --      Exclude from Growth Chart --     Most recent vital signs: Vitals:   09/24/22 1309  BP: (!) 145/90  Pulse: 78  Resp: 17  Temp: 98.3 F (36.8 C)  SpO2: 96%    General: Awake, no distress.  CV:  Good peripheral perfusion.  Regular rate and rhythm  Resp:  Normal effort.  Equal breath sounds bilaterally.  Abd:  No distention.  Soft, nontender.  No rebound or guarding.  ED Results / Procedures / Treatments   EKG  EKG viewed and interpreted by myself shows normal sinus rhythm at 79 bpm with a narrow QRS, left axis  deviation, largely normal intervals with nonspecific ST changes.  RADIOLOGY  I have reviewed and interpreted chest x-ray images.  No consolidation seen my evaluation. Radiology is read it as an increased gastric distention. CT scan shows a liver mass.   MEDICATIONS ORDERED IN ED: Medications  iohexol (OMNIPAQUE) 300 MG/ML solution 100 mL (100 mLs Intravenous Contrast Given 09/24/22 1757)     IMPRESSION / MDM / ASSESSMENT AND PLAN / ED COURSE  I reviewed the triage vital signs and the nursing notes.  Patient's presentation is most consistent with acute presentation with potential threat to life or bodily function.  Patient presents the emergency department for symptoms of nausea weight loss intermittent shortness of breath.  Overall the patient appears well, reassuring vital signs, reassuring physical exam benign abdomen clear lung sounds.  Patient's lab work shows a reassuring chemistry, reassuring CBC COVID test is negative, troponin is normal.  Patient's chest x-ray is clear.  Patient CT scan however has resulted showing a liver mass.  Patient states he is ready to go but will follow-up with  his doctor regarding this.  I have added on hepatic function panel and have discussed with the patient that he will need follow-up with his doctor for an MRI very soon to rule out any cancerous cause of the mass.  Patient understands and is agreeable to this plan.  FINAL CLINICAL IMPRESSION(S) / ED DIAGNOSES   Nausea  Rx / DC Orders   Reglan  Note:  This document was prepared using Dragon voice recognition software and may include unintentional dictation errors.   Minna Antis, MD 09/24/22 2050

## 2022-09-24 NOTE — Discharge Instructions (Addendum)
Your CT has resulted showing a mass in your liver.  Please follow-up with your doctor soon as possible for further evaluation.  This will need to be worked up further by your doctor such as with an MRI to evaluate for any possible cancer.  Please take your medication as prescribed only as written.  Follow-up with your doctor in the next several days for recheck.  Return to the emergency department for any worsening symptoms.

## 2022-09-24 NOTE — ED Triage Notes (Signed)
Pt here with cp that started at 1100. Pt states pain is left sided and radiates to both feet. Pt describes pain as more "pressure". Pt denies NVD or cardiac hx.

## 2022-10-04 ENCOUNTER — Other Ambulatory Visit: Payer: Self-pay | Admitting: Internal Medicine

## 2022-10-04 DIAGNOSIS — R160 Hepatomegaly, not elsewhere classified: Secondary | ICD-10-CM

## 2022-10-07 ENCOUNTER — Ambulatory Visit: Payer: 59 | Attending: Internal Medicine

## 2022-10-11 ENCOUNTER — Ambulatory Visit: Admission: RE | Admit: 2022-10-11 | Payer: 59 | Source: Ambulatory Visit

## 2022-10-11 ENCOUNTER — Ambulatory Visit
Admission: RE | Admit: 2022-10-11 | Discharge: 2022-10-11 | Disposition: A | Discharge status: Home / Self Care | Payer: 59 | Source: Ambulatory Visit | Attending: Internal Medicine | Admitting: Internal Medicine

## 2022-10-11 DIAGNOSIS — R160 Hepatomegaly, not elsewhere classified: Secondary | ICD-10-CM | POA: Insufficient documentation

## 2022-10-11 MED ORDER — GADOBUTROL 1 MMOL/ML IV SOLN
7.5000 mL | Freq: Once | INTRAVENOUS | Status: AC | PRN
  Administered 2022-10-11: 7.5 mL via INTRAVENOUS

## 2022-10-25 ENCOUNTER — Encounter: Payer: Self-pay | Admitting: Oncology

## 2022-10-25 ENCOUNTER — Inpatient Hospital Stay: Payer: 59 | Attending: Oncology | Admitting: Oncology

## 2022-10-25 ENCOUNTER — Inpatient Hospital Stay: Payer: 59

## 2022-10-25 ENCOUNTER — Ambulatory Visit: Payer: 59 | Admitting: Oncology

## 2022-10-25 ENCOUNTER — Other Ambulatory Visit: Payer: 59

## 2022-10-25 VITALS — BP 127/96 | HR 71 | Temp 97.5°F | Resp 16 | Ht 67.0 in | Wt 183.0 lb

## 2022-10-25 DIAGNOSIS — D649 Anemia, unspecified: Secondary | ICD-10-CM | POA: Insufficient documentation

## 2022-10-25 DIAGNOSIS — R772 Abnormality of alphafetoprotein: Secondary | ICD-10-CM | POA: Insufficient documentation

## 2022-10-25 DIAGNOSIS — Z79899 Other long term (current) drug therapy: Secondary | ICD-10-CM | POA: Diagnosis not present

## 2022-10-25 DIAGNOSIS — D378 Neoplasm of uncertain behavior of other specified digestive organs: Secondary | ICD-10-CM | POA: Insufficient documentation

## 2022-10-25 DIAGNOSIS — Z8619 Personal history of other infectious and parasitic diseases: Secondary | ICD-10-CM | POA: Diagnosis not present

## 2022-10-25 DIAGNOSIS — R932 Abnormal findings on diagnostic imaging of liver and biliary tract: Secondary | ICD-10-CM | POA: Diagnosis not present

## 2022-10-25 DIAGNOSIS — R160 Hepatomegaly, not elsewhere classified: Secondary | ICD-10-CM | POA: Insufficient documentation

## 2022-10-25 LAB — CBC WITH DIFFERENTIAL/PLATELET
Abs Immature Granulocytes: 0.02 10*3/uL (ref 0.00–0.07)
Basophils Absolute: 0 10*3/uL (ref 0.0–0.1)
Basophils Relative: 0 %
Eosinophils Absolute: 0.3 10*3/uL (ref 0.0–0.5)
Eosinophils Relative: 4 %
HCT: 37.6 % — ABNORMAL LOW (ref 39.0–52.0)
Hemoglobin: 11.8 g/dL — ABNORMAL LOW (ref 13.0–17.0)
Immature Granulocytes: 0 %
Lymphocytes Relative: 27 %
Lymphs Abs: 2.3 10*3/uL (ref 0.7–4.0)
MCH: 22.1 pg — ABNORMAL LOW (ref 26.0–34.0)
MCHC: 31.4 g/dL (ref 30.0–36.0)
MCV: 70.5 fL — ABNORMAL LOW (ref 80.0–100.0)
Monocytes Absolute: 1 10*3/uL (ref 0.1–1.0)
Monocytes Relative: 12 %
Neutro Abs: 4.9 10*3/uL (ref 1.7–7.7)
Neutrophils Relative %: 57 %
Platelets: 334 10*3/uL (ref 150–400)
RBC: 5.33 MIL/uL (ref 4.22–5.81)
RDW: 15.9 % — ABNORMAL HIGH (ref 11.5–15.5)
WBC: 8.6 10*3/uL (ref 4.0–10.5)
nRBC: 0 % (ref 0.0–0.2)

## 2022-10-25 LAB — COMPREHENSIVE METABOLIC PANEL
ALT: 18 U/L (ref 0–44)
AST: 28 U/L (ref 15–41)
Albumin: 4.1 g/dL (ref 3.5–5.0)
Alkaline Phosphatase: 69 U/L (ref 38–126)
Anion gap: 6 (ref 5–15)
BUN: 15 mg/dL (ref 8–23)
CO2: 27 mmol/L (ref 22–32)
Calcium: 9.1 mg/dL (ref 8.9–10.3)
Chloride: 103 mmol/L (ref 98–111)
Creatinine, Ser: 0.99 mg/dL (ref 0.61–1.24)
GFR, Estimated: >60 mL/min (ref >60)
Glucose, Bld: 105 mg/dL — ABNORMAL HIGH (ref 70–99)
Potassium: 4.7 mmol/L (ref 3.5–5.1)
Sodium: 136 mmol/L (ref 135–145)
Total Bilirubin: 0.3 mg/dL (ref 0.3–1.2)
Total Protein: 8 g/dL (ref 6.5–8.1)

## 2022-10-25 NOTE — Progress Notes (Unsigned)
[facility]  [phone] [phone]  ID: Linton Rump OB: Feb 17, 1950  MR#: [id number]  UYQ#:[id number]  Patient Care Team: [facility] as PCP - General Benita Gutter, RN as Oncology Nurse Navigator Orlie Dakin, Tollie Pizza, MD as Consulting Physician (Oncology)  CHIEF COMPLAINT: Liver mass, highly suspicious for hepatocellular carcinoma.  INTERVAL HISTORY: Patient is a 72 year old male who recently presented to the emergency room with mild weight loss and chest pain.  Subsequent imaging revealed a lesion in his liver highly suspicious for underlying malignancy.  Currently, he feels well and is asymptomatic.  He has no neurologic complaints.  He denies any recent fevers or illnesses.  He has a good appetite and denies any further weight loss.  He has no chest pain, shortness of breath, cough, or hemoptysis.  He denies any nausea, vomiting, constipation, or diarrhea.  He has no melena or hematochezia.  He denies any urinary complaints.  Patient offers no specific complaints today.  REVIEW OF SYSTEMS:   Review of Systems  Constitutional:  Positive for weight loss. Negative for fever and malaise/fatigue.  Respiratory: Negative.  Negative for cough, hemoptysis and shortness of breath.   Cardiovascular: Negative.  Negative for chest pain and leg swelling.  Gastrointestinal: Negative.  Negative for abdominal pain, blood in stool and melena.  Genitourinary: Negative.  Negative for dysuria.  Musculoskeletal: Negative.  Negative for back pain.  Skin: Negative.  Negative for rash.  Neurological: Negative.  Negative for dizziness, focal weakness, weakness and headaches.  Psychiatric/Behavioral: Negative.  The patient is not nervous/anxious.     As per HPI. Otherwise, a complete review of systems is negative.  PAST MEDICAL HISTORY: Past Medical History:  Diagnosis Date   Hypercholesteremia    Hypertension     PAST SURGICAL HISTORY: History  reviewed. No pertinent surgical history.  FAMILY HISTORY: History reviewed. No pertinent family history.  ADVANCED DIRECTIVES (Y/N):  N  HEALTH MAINTENANCE: Social History   Tobacco Use   Smoking status: Never   Smokeless tobacco: Never  Substance Use Topics   Alcohol use: Not Currently   Drug use: Not Currently     Colonoscopy:  PAP:  Bone density:  Lipid panel:  No Known Allergies  Current Outpatient Medications  Medication Sig Dispense Refill   ibuprofen (ADVIL) 800 MG tablet Take 800 mg by mouth every 6 (six) hours as needed.     levocetirizine (XYZAL) 5 MG tablet Take 5 mg by mouth every evening.     losartan (COZAAR) 50 MG tablet Take 50 mg by mouth 2 (two) times daily.     pravastatin (PRAVACHOL) 40 MG tablet Take 40 mg by mouth at bedtime.     prednisoLONE acetate (PRED FORTE) 1 % ophthalmic suspension Place 1 drop into the right eye every 4 (four) hours.     Travoprost, BAK Free, (TRAVATAN) 0.004 % SOLN ophthalmic solution Place 1 drop into both eyes at bedtime.     traZODone (DESYREL) 100 MG tablet Take 100 mg by mouth at bedtime.     trimethoprim-polymyxin b (POLYTRIM) ophthalmic solution Place 1 drop into the right eye every 4 (four) hours.     No current facility-administered medications for this visit.    OBJECTIVE: Vitals:   10/25/22 1355  BP: (!) 127/96  Pulse: 71  Resp: 16  Temp: (!) 97.5 F (36.4 C)  SpO2: 100%     Body mass index is 28.66 kg/m.    ECOG FS:0 - Asymptomatic  General: Well-developed,  well-nourished, no acute distress. Eyes: Pink conjunctiva, anicteric sclera. HEENT: Normocephalic, moist mucous membranes. Lungs: No audible wheezing or coughing. Heart: Regular rate and rhythm. Abdomen: Soft, nontender, no obvious distention. Musculoskeletal: No edema, cyanosis, or clubbing. Neuro: Alert, answering all questions appropriately. Cranial nerves grossly intact. Skin: No rashes or petechiae noted. Psych: Normal affect. Lymphatics:  No cervical, calvicular, axillary or inguinal LAD.   LAB RESULTS:  Lab Results  Component Value Date   NA 136 10/25/2022   K 4.7 10/25/2022   CL 103 10/25/2022   CO2 27 10/25/2022   GLUCOSE 105 (H) 10/25/2022   BUN 15 10/25/2022   CREATININE 0.99 10/25/2022   CALCIUM 9.1 10/25/2022   PROT 8.0 10/25/2022   ALBUMIN 4.1 10/25/2022   AST 28 10/25/2022   ALT 18 10/25/2022   ALKPHOS 69 10/25/2022   BILITOT 0.3 10/25/2022   GFRNONAA >60 10/25/2022    Lab Results  Component Value Date   WBC 8.6 10/25/2022   NEUTROABS 4.9 10/25/2022   HGB 11.8 (L) 10/25/2022   HCT 37.6 (L) 10/25/2022   MCV 70.5 (L) 10/25/2022   PLT 334 10/25/2022     STUDIES: MR ABDOMEN WWO CONTRAST  Result Date: 10/12/2022 CLINICAL DATA:  Right hepatic lobe lesion on prior CT examination for further characterization. EXAM: MRI ABDOMEN WITHOUT AND WITH CONTRAST TECHNIQUE: Multiplanar multisequence MR imaging of the abdomen was performed both before and after the administration of intravenous contrast. CONTRAST:  7.67mL GADAVIST GADOBUTROL 1 MMOL/ML IV SOLN COMPARISON:  09/24/2022 FINDINGS: Lower chest: Unremarkable Hepatobiliary: No underlying cirrhotic morphology identified. In the dome of the right hepatic lobe, a 7.0 by 5.5 by 4.4 cm mass with low T1 and moderately high T2 signal characteristics demonstrates extensive internal heterogeneous and reticular enhancement as on image 14 of series 14, with a suggestion of an enhancing capsule or pseudo capsule on delayed images such as image 18 of series 21. There is associated transient hepatic attenuation difference on the early arterial phase images. There is a substantial component of arterial phase enhancement. No substantial degree of washout observed. This lesion restricts diffusion on image 77 of series 8. Appearance raises suspicion for primary hepatic malignancy such as hepatocellular carcinoma, a metastatic lesion is a less likely differential diagnostic  consideration. Gallbladder unremarkable. No portal vein thrombosis. No associated biliary dilatation. Pancreas:  Unremarkable Spleen: Unremarkable, suspected accessory spleen superomedial to the left adrenal gland. Adrenals/Urinary Tract: Two small Bosniak category 1 left mid kidney cysts, no further workup is indicated. Adrenal glands unremarkable. Stomach/Bowel: Unremarkable Vascular/Lymphatic: Porta hepatis lymph node 0.9 cm in short axis on image 19 series 12. Left periaortic lymph node 0.9 cm in short axis on image 22 series 12. No overt pathologic adenopathy is observed. Other:  No supplemental non-categorized findings. Musculoskeletal: Unremarkable IMPRESSION: 1. 7.0 cm mass in the dome of the right hepatic lobe with extensive internal heterogeneous and reticular enhancement and a suggestion of an enhancing capsule or pseudocapsule on delayed images. Appearance raises suspicion for primary hepatic malignancy such as hepatocellular carcinoma, a metastatic lesion is a less likely differential diagnostic consideration. Correlation with tumor markers and biopsy is recommended. 2. Borderline prominent porta hepatis and left periaortic lymph nodes, but no overt pathologic adenopathy is observed. Electronically Signed   By: Gaylyn Rong M.D.   On: 10/12/2022 15:34    ASSESSMENT: Liver mass, highly suspicious for hepatocellular carcinoma.  PLAN:    Liver mass: Highly suspicious for underlying malignancy, possibly hepatocellular carcinoma.  MRI results from October 12, 2022 reviewed independently and reported as above with a 7 cm mass in the dome of the right hepatic lobe consistent with hepatic primary.  He has a history of heavy alcohol use prior to 2015 as well as treated hepatitis C also in 2015.  AFP is only mildly elevated at 38.9.  CA 19-9 and CEA are within normal limits.  Patient will require a PET scan to complete the staging workup as well as a liver biopsy to confirm the diagnosis.  Patient will  return to clinic approximately 1 week after his diagnostic studies to discuss the results and treatment planning. Anemia: Mild, monitor.  Patient's hemoglobin is 11.9.  I spent a total of 60 minutes reviewing chart data, face-to-face evaluation with the patient, counseling and coordination of care as detailed above.   Patient expressed understanding and was in agreement with this plan. He also understands that He can call clinic at any time with any questions, concerns, or complaints.    Cancer Staging  No matching staging information was found for the patient.   Jeralyn Ruths, MD   10/26/2022 9:48 AM

## 2022-10-26 LAB — CEA: CEA: 0.9 ng/mL (ref 0.0–4.7)

## 2022-10-26 LAB — AFP TUMOR MARKER: AFP, Serum, Tumor Marker: 38.9 ng/mL — ABNORMAL HIGH (ref 0.0–8.4)

## 2022-10-26 LAB — CANCER ANTIGEN 19-9: CA 19-9: <2 U/mL (ref 0–35)

## 2022-10-26 NOTE — Progress Notes (Signed)
Irish Lack, MD sent to [facility] PROCEDURE / BIOPSY REVIEW Date: 10/25/22  Requested Biopsy site: Right lobe liver mass Reason for request: Liver mass Imaging review: Best seen on CT and MRI  Decision: Approved Imaging modality to perform: Ultrasound Schedule with: Moderate Sedation Schedule for: Any VIR  Additional comments: @Schedulers .  Please contact me with questions, concerns, or if issue pertaining to this request arise.  Reola Calkins, MD [facility]

## 2022-10-27 ENCOUNTER — Telehealth: Payer: Self-pay

## 2022-10-27 NOTE — Telephone Encounter (Signed)
Spoke with patient about liver biopsy appointment Friday 10/25 at 10a and arrive at 9a. Patient expressed understanding and confirmed date and time.

## 2022-11-01 ENCOUNTER — Ambulatory Visit
Admission: RE | Admit: 2022-11-01 | Discharge: 2022-11-01 | Disposition: A | Discharge status: Home / Self Care | Payer: 59 | Source: Ambulatory Visit | Attending: Oncology | Admitting: Oncology

## 2022-11-01 DIAGNOSIS — R160 Hepatomegaly, not elsewhere classified: Secondary | ICD-10-CM | POA: Insufficient documentation

## 2022-11-01 LAB — GLUCOSE, CAPILLARY: Glucose-Capillary: 90 mg/dL (ref 70–99)

## 2022-11-01 MED ORDER — FLUDEOXYGLUCOSE F - 18 (FDG) INJECTION
9.8300 | Freq: Once | INTRAVENOUS | Status: AC
  Administered 2022-11-01: 9.83 via INTRAVENOUS

## 2022-11-04 ENCOUNTER — Other Ambulatory Visit: Payer: Self-pay | Admitting: Student

## 2022-11-04 DIAGNOSIS — Z01812 Encounter for preprocedural laboratory examination: Secondary | ICD-10-CM

## 2022-11-04 NOTE — Progress Notes (Signed)
Patient for US guided RT Lobe Liver Biopsy on Friday 11/05/2022, I called and spoke with the patient on the phone and gave pre-procedure instructions. Pt was made aware to be here at 9a, NPO after MN prior to procedure as well as driver post procedure/recovery/discharge. Pt stated understanding.  Called 11/04/2022

## 2022-11-05 ENCOUNTER — Ambulatory Visit
Admission: RE | Admit: 2022-11-05 | Discharge: 2022-11-05 | Disposition: A | Discharge status: Home / Self Care | Payer: 59 | Source: Ambulatory Visit | Attending: Oncology | Admitting: Oncology

## 2022-11-05 ENCOUNTER — Other Ambulatory Visit: Payer: Self-pay

## 2022-11-05 DIAGNOSIS — C220 Liver cell carcinoma: Secondary | ICD-10-CM | POA: Insufficient documentation

## 2022-11-05 DIAGNOSIS — R160 Hepatomegaly, not elsewhere classified: Secondary | ICD-10-CM | POA: Diagnosis present

## 2022-11-05 DIAGNOSIS — E7800 Pure hypercholesterolemia, unspecified: Secondary | ICD-10-CM | POA: Diagnosis not present

## 2022-11-05 DIAGNOSIS — I10 Essential (primary) hypertension: Secondary | ICD-10-CM | POA: Insufficient documentation

## 2022-11-05 DIAGNOSIS — Z01812 Encounter for preprocedural laboratory examination: Secondary | ICD-10-CM | POA: Diagnosis present

## 2022-11-05 LAB — CBC
HCT: 34.9 % — ABNORMAL LOW (ref 39.0–52.0)
Hemoglobin: 11.2 g/dL — ABNORMAL LOW (ref 13.0–17.0)
MCH: 21.8 pg — ABNORMAL LOW (ref 26.0–34.0)
MCHC: 32.1 g/dL (ref 30.0–36.0)
MCV: 68 fL — ABNORMAL LOW (ref 80.0–100.0)
Platelets: 326 10*3/uL (ref 150–400)
RBC: 5.13 MIL/uL (ref 4.22–5.81)
RDW: 14.9 % (ref 11.5–15.5)
WBC: 5.9 10*3/uL (ref 4.0–10.5)
nRBC: 0 % (ref 0.0–0.2)

## 2022-11-05 LAB — PROTIME-INR
INR: 1 (ref 0.8–1.2)
Prothrombin Time: 12.9 s (ref 11.4–15.2)

## 2022-11-05 LAB — COMPREHENSIVE METABOLIC PANEL
ALT: 18 U/L (ref 0–44)
AST: 21 U/L (ref 15–41)
Albumin: 3.8 g/dL (ref 3.5–5.0)
Alkaline Phosphatase: 65 U/L (ref 38–126)
Anion gap: 8 (ref 5–15)
BUN: 17 mg/dL (ref 8–23)
CO2: 23 mmol/L (ref 22–32)
Calcium: 8.7 mg/dL — ABNORMAL LOW (ref 8.9–10.3)
Chloride: 105 mmol/L (ref 98–111)
Creatinine, Ser: 1.06 mg/dL (ref 0.61–1.24)
GFR, Estimated: >60 mL/min (ref >60)
Glucose, Bld: 126 mg/dL — ABNORMAL HIGH (ref 70–99)
Potassium: 3.9 mmol/L (ref 3.5–5.1)
Sodium: 136 mmol/L (ref 135–145)
Total Bilirubin: 0.5 mg/dL (ref 0.3–1.2)
Total Protein: 7.5 g/dL (ref 6.5–8.1)

## 2022-11-05 MED ORDER — LIDOCAINE HCL (PF) 1 % IJ SOLN
10.0000 mL | Freq: Once | INTRAMUSCULAR | Status: AC
  Administered 2022-11-05: 10 mL via INTRADERMAL
  Filled 2022-11-05: qty 10

## 2022-11-05 MED ORDER — MIDAZOLAM HCL 2 MG/2ML IJ SOLN
INTRAMUSCULAR | Status: AC
  Filled 2022-11-05: qty 4

## 2022-11-05 MED ORDER — MIDAZOLAM HCL 5 MG/5ML IJ SOLN
INTRAMUSCULAR | Status: AC | PRN
Start: 2022-11-05 — End: 2022-11-05
  Administered 2022-11-05: 1 mg via INTRAVENOUS

## 2022-11-05 MED ORDER — FENTANYL CITRATE (PF) 100 MCG/2ML IJ SOLN
INTRAMUSCULAR | Status: AC | PRN
Start: 2022-11-05 — End: 2022-11-05
  Administered 2022-11-05: 50 ug via INTRAVENOUS

## 2022-11-05 MED ORDER — FENTANYL CITRATE (PF) 100 MCG/2ML IJ SOLN
INTRAMUSCULAR | Status: AC
  Filled 2022-11-05: qty 4

## 2022-11-05 NOTE — Procedures (Signed)
Interventional Radiology Procedure Note  Procedure: US guided biopsy of right liver mass Complications: None EBL: None Recommendations: - Bedrest 2 hours.   - Routine wound care - Follow up pathology - Advance diet  - ok to restart all home meds - no heavy lifting (<10lbs) for 48 hours  Signed,  Gilmer Mor, DO

## 2022-11-05 NOTE — Discharge Instructions (Signed)
Liver Biopsy, Care After These instructions give you information about how to care for yourself after your procedure. Your health care provider may also give you more specific instructions. If you have problems or questions, contact your health care provider. What can I expect after the procedure? After your procedure, it is common to have:  Pain and soreness in the area where the biopsy was done.  Bruising around the area where the biopsy was done.  Sleepiness and fatigue for 1-2 days. Follow these instructions at home: Medicines  Take over-the-counter and prescription medicines only as told by your health care provider.  If you were prescribed an antibiotic medicine, take it as told by your health care provider. Do not stop taking the antibiotic even if you start to feel better.  Do not take medicines such as aspirin and ibuprofen unless your health care provider tells you to take them. These medicines thin your blood and can increase the risk of bleeding.  If you are taking prescription pain medicine, take actions to prevent or treat constipation. Your health care provider may recommend that you: ? Drink enough fluid to keep your urine pale yellow. ? Eat foods that are high in fiber, such as fresh fruits and vegetables, whole grains, and beans. ? Limit foods that are high in fat and processed sugars, such as fried or sweet foods. ? Take an over-the-counter or prescription medicine for constipation. Incision care ? Wash your hands with soap and water before you change your bandage (dressing). If soap and water are not available, use hand sanitizer. ? Change your bandage tomorrow after you shower and then remove the next day.  Check your incision area every day for signs of infection. Check for: ? Redness, swelling, or pain. ? Fluid or blood. ? Warmth. ? Pus or a bad smell. You may shower tomorrow  No lifting more than 5 lbs for 3 days  Return to your normal activities as told  by your health care provider.   Do not drive or use heavy machinery for 24hrs or while taking prescription pain medicine.  Do not play contact sports for 2 weeks after the procedure. General instructions   Do not drink alcohol in the first week after the procedure.  Have someone stay with you for at least 24 hours after the procedure.  It is your responsibility to obtain your test results. Ask your health care provider, or the department that is doing the test: ? When will my results be ready? ? How will I get my results? ? What are my treatment options? ? What other tests do I need? ? What are my next steps?  Keep all follow-up visits as told by your health care provider. This is important. Contact a health care provider if:  You have increased bleeding from an incision, resulting in more than a small spot of blood.  You have redness, swelling, or increasing pain in any incisions.  You notice a discharge or a bad smell coming from any of your incisions.  You have a fever or chills. Get help right away if:  You develop swelling, bloating, or pain in your abdomen.  You become dizzy or faint.  You develop a rash.  You have nausea or you vomit.  You faint, or you have shortness of breath or difficulty breathing.  You develop chest pain.  You have problems with your speech or vision.  You have trouble with your balance or moving your arms or legs. Summary  After the liver biopsy, it is common to have pain, soreness, and bruising in the area, as well as sleepiness and fatigue.  Take over-the-counter and prescription medicines only as told by your health care provider.  Follow instructions from your health care provider about how to care for your incision. Check the incision area daily for signs of infection. This information is not intended to replace advice given to you by your health care provider. Make sure you discuss any questions you have with your health care  provider. Document Released: 07/17/2004 Document Revised: 02/20/2018 Document Reviewed: 01/07/2017 Elsevier Patient Education  2020 [employer].

## 2022-11-05 NOTE — Consult Note (Signed)
Chief Complaint: Patient was seen in consultation today for percutaneous liver biopsy for liver lesion, and chief complaints of weight loss and chest pain, at the request of Dr. Elita Quick.  Supervising Physician: Gilmer Mor  Patient Status: [facility] - Out-pt  History of Present Illness: Aaron Lowery is a 72 y.o. male with a medical history of hypercholesteremia and HTN. He presented to ED on 10/14 with recent weight loss and chest pain. On 10/1 a CT abdomen revealed a 7.0 cm mass in the dome of his right hepatic lobe, suspicious for malignancy. A request was made by Dr. Catalina Antigua for percutaneous liver biopsy of liver lesion.  Past Medical History:  Diagnosis Date   Hypercholesteremia    Hypertension     No past surgical history on file.  Allergies: Patient has no known allergies.  Medications: Prior to Admission medications   Medication Sig Start Date End Date Taking? Authorizing Provider  ibuprofen (ADVIL) 800 MG tablet Take 800 mg by mouth every 6 (six) hours as needed. 10/22/22  Yes [provider]  levocetirizine (XYZAL) 5 MG tablet Take 5 mg by mouth every evening.   Yes [provider]  losartan (COZAAR) 50 MG tablet Take 50 mg by mouth 2 (two) times daily.   Yes [provider]  pravastatin (PRAVACHOL) 40 MG tablet Take 40 mg by mouth at bedtime.   Yes [provider]  prednisoLONE acetate (PRED FORTE) 1 % ophthalmic suspension Place 1 drop into the right eye every 4 (four) hours. 07/26/22  Yes [provider]  Travoprost, BAK Free, (TRAVATAN) 0.004 % SOLN ophthalmic solution Place 1 drop into both eyes at bedtime.   Yes [provider]  traZODone (DESYREL) 100 MG tablet Take 100 mg by mouth at bedtime. 05/13/22  Yes [provider]  trimethoprim-polymyxin b (POLYTRIM) ophthalmic solution Place 1 drop into the right eye every 4 (four) hours. 02/04/22  Yes [provider]     No family history on  file.  Social History   Socioeconomic History   Marital status: Married    Spouse name: Not on file   Number of children: Not on file   Years of education: Not on file   Highest education level: Not on file  Occupational History   Not on file  Tobacco Use   Smoking status: Never   Smokeless tobacco: Never  Substance and Sexual Activity   Alcohol use: Not Currently   Drug use: Not Currently   Sexual activity: Not on file  Other Topics Concern   Not on file  Social History Narrative   Not on file   Social Determinants of Health   Financial Resource Strain: Low Risk  (10/25/2022)   Overall Financial Resource Strain (CARDIA)    Difficulty of Paying Living Expenses: Not hard at all  Food Insecurity: No Food Insecurity (10/25/2022)   Hunger Vital Sign    Worried About Running Out of Food in the Last Year: Never true    Ran Out of Food in the Last Year: Never true  Transportation Needs: No Transportation Needs (10/25/2022)   PRAPARE - Administrator, Civil Service (Medical): No    Lack of Transportation (Non-Medical): No  Physical Activity: Insufficiently Active (01/27/2021)   Received from [facility], [facility]   Exercise Vital Sign    Days of Exercise per Week: 3 days    Minutes of Exercise per Session: 30 min  Stress: Stress Concern  Present (01/27/2021)   Received from [facility], [facility]   [employer] of Occupational Health - Occupational Stress Questionnaire    Feeling of Stress : To some extent  Social Connections: Socially Integrated (01/27/2021)   Received from [facility], [facility]   Social Connection and Isolation Panel [NHANES]    Frequency of Communication with Friends and Family: Three times a week    Frequency of Social Gatherings with Friends and Family: Once a week    Attends Religious Services: More than 4 times per year     Active Member of [employer] or Organizations: Yes    Attends Banker Meetings: 1 to 4 times per year    Marital Status: Married     Review of Systems: A 12 point ROS discussed and pertinent positives are indicated in the HPI above.  All other systems are negative.  Review of Systems  Constitutional:  Negative for activity change, appetite change, chills, fatigue and fever.  Respiratory:  Positive for cough. Negative for chest tightness and shortness of breath.   Cardiovascular:  Negative for chest pain, palpitations and leg swelling.  Gastrointestinal:  Negative for abdominal distention, abdominal pain, diarrhea, nausea and vomiting.  Skin:  Negative for color change.  Neurological:  Negative for speech difficulty and headaches.  Psychiatric/Behavioral:  Negative for behavioral problems and confusion.     Vital Signs: .Marland Kitchen Vitals:   11/05/22 0953  BP: (!) 136/94  Pulse: 68  Resp: 12  Temp: 97.8 F (36.6 C)  SpO2: 98%     Advance Care Plan: None  Full Code per patient   Physical Exam Constitutional:      Appearance: Normal appearance.  HENT:     Mouth/Throat:     Mouth: Mucous membranes are moist.  Cardiovascular:     Rate and Rhythm: Normal rate and regular rhythm.     Pulses: Normal pulses.     Heart sounds: Normal heart sounds.  Pulmonary:     Effort: Pulmonary effort is normal.     Breath sounds: Normal breath sounds. No wheezing.  Abdominal:     General: There is no distension.     Palpations: Abdomen is soft.     Tenderness: There is no abdominal tenderness.  Musculoskeletal:     Right lower leg: No edema.     Left lower leg: No edema.  Skin:    General: Skin is warm and dry.  Neurological:     Mental Status: He is alert and oriented to person, place, and time.  Psychiatric:        Mood and Affect: Mood normal.        Behavior: Behavior normal.     Imaging: MR ABDOMEN WWO CONTRAST  Result Date: 10/12/2022 CLINICAL DATA:  Right hepatic  lobe lesion on prior CT examination for further characterization. EXAM: MRI ABDOMEN WITHOUT AND WITH CONTRAST TECHNIQUE: Multiplanar multisequence MR imaging of the abdomen was performed both before and after the administration of intravenous contrast. CONTRAST:  7.68mL GADAVIST GADOBUTROL 1 MMOL/ML IV SOLN COMPARISON:  09/24/2022 FINDINGS: Lower chest: Unremarkable Hepatobiliary: No underlying cirrhotic morphology identified. In the dome of the right hepatic lobe, a 7.0 by 5.5 by 4.4 cm mass with low T1 and moderately high T2 signal characteristics demonstrates extensive internal heterogeneous and reticular enhancement as on image 14 of series 14, with a suggestion of an enhancing capsule or pseudo capsule on delayed images such as image 18 of  series 21. There is associated transient hepatic attenuation difference on the early arterial phase images. There is a substantial component of arterial phase enhancement. No substantial degree of washout observed. This lesion restricts diffusion on image 77 of series 8. Appearance raises suspicion for primary hepatic malignancy such as hepatocellular carcinoma, a metastatic lesion is a less likely differential diagnostic consideration. Gallbladder unremarkable. No portal vein thrombosis. No associated biliary dilatation. Pancreas:  Unremarkable Spleen: Unremarkable, suspected accessory spleen superomedial to the left adrenal gland. Adrenals/Urinary Tract: Two small Bosniak category 1 left mid kidney cysts, no further workup is indicated. Adrenal glands unremarkable. Stomach/Bowel: Unremarkable Vascular/Lymphatic: Porta hepatis lymph node 0.9 cm in short axis on image 19 series 12. Left periaortic lymph node 0.9 cm in short axis on image 22 series 12. No overt pathologic adenopathy is observed. Other:  No supplemental non-categorized findings. Musculoskeletal: Unremarkable IMPRESSION: 1. 7.0 cm mass in the dome of the right hepatic lobe with extensive internal heterogeneous  and reticular enhancement and a suggestion of an enhancing capsule or pseudocapsule on delayed images. Appearance raises suspicion for primary hepatic malignancy such as hepatocellular carcinoma, a metastatic lesion is a less likely differential diagnostic consideration. Correlation with tumor markers and biopsy is recommended. 2. Borderline prominent porta hepatis and left periaortic lymph nodes, but no overt pathologic adenopathy is observed. Electronically Signed   By: Gaylyn Rong M.D.   On: 10/12/2022 15:34    Labs:  CBC: Recent Labs    08/29/22 2059 09/24/22 1311 10/25/22 1501  WBC 7.6 7.0 8.6  HGB 11.4* 12.0* 11.8*  HCT 36.3* 37.2* 37.6*  PLT 353 337 334    COAGS: No results for input(s): "INR", "APTT" in the last 8760 hours.  BMP: Recent Labs    08/29/22 2059 09/24/22 1311 10/25/22 1501  NA 138 136 136  K 3.3* 3.7 4.7  CL 104 104 103  CO2 21* 23 27  GLUCOSE 129* 148* 105*  BUN 13 12 15   CALCIUM 9.2 9.2 9.1  CREATININE 1.06 1.03 0.99  GFRNONAA >60 >60 >60    LIVER FUNCTION TESTS: Recent Labs    09/24/22 1311 10/25/22 1501  BILITOT 0.7 0.3  AST 24 28  ALT 17 18  ALKPHOS 61 69  PROT 7.8 8.0  ALBUMIN 4.1 4.1    Assessment and Plan: Aaron Lowery is a 72 y.o. male with a medical history of hypercholesteremia and HTN. On 10/1 a CT abdomen revealed a 7.0 cm mass in the dome of his right hepatic lobe, suspicious for malignancy. A request was made by Dr. Catalina Antigua for percutaneous liver biopsy of liver lesion. The patient has been NPO since midnight, states he has a slight cough, he is afebrile and in nonapparent distress. CBC, BMET. and PT / INR are pending results at this time.  Images were reviewed and approved by Dr. Sarita Haver. Dr Mosie Epstein is scheduled to proceed with procedure in IR today.   Risks and benefits of percutaneous liver biopsy was discussed with the patient and/or patient's family including, but not limited to bleeding, infection, damage  to adjacent structures or low yield requiring additional tests.  All of the questions were answered and there is agreement to proceed.  Consent signed and in chart. Thank you for this interesting consult.  I greatly enjoyed meeting Aaron Lowery and look forward to participating in their care.  A copy of this report was sent to the requesting provider on this date.  Electronically Signed: Ardith Dark, NP 11/05/2022, 9:51  AM   I spent a total of  15 Minutes   in face to face in clinical consultation, greater than 50% of which was counseling/coordinating care for percutaneous liver biopsy

## 2022-11-08 ENCOUNTER — Other Ambulatory Visit: Payer: Self-pay | Admitting: Pathology

## 2022-11-08 LAB — SURGICAL PATHOLOGY

## 2022-11-11 ENCOUNTER — Other Ambulatory Visit: Payer: 59

## 2022-11-15 ENCOUNTER — Telehealth: Payer: Self-pay

## 2022-11-15 NOTE — Telephone Encounter (Signed)
Patient called stating he had a PET SCAN (10/21) and Biopsy (10/25) done. Patient is requesting a sooner appointment to get the results. I stated to patient he had an appointment on Thursday to discuss those results.. Patient still states he would like an earlier appointment for this week.

## 2022-11-18 ENCOUNTER — Inpatient Hospital Stay: Payer: 59 | Attending: Oncology | Admitting: Oncology

## 2022-11-18 ENCOUNTER — Encounter: Payer: Self-pay | Admitting: Oncology

## 2022-11-18 VITALS — BP 118/86 | HR 84 | Temp 96.0°F | Resp 16 | Ht 67.0 in | Wt 182.8 lb

## 2022-11-18 DIAGNOSIS — C220 Liver cell carcinoma: Secondary | ICD-10-CM | POA: Insufficient documentation

## 2022-11-18 DIAGNOSIS — D649 Anemia, unspecified: Secondary | ICD-10-CM | POA: Diagnosis present

## 2022-11-18 DIAGNOSIS — Z87891 Personal history of nicotine dependence: Secondary | ICD-10-CM | POA: Insufficient documentation

## 2022-11-18 DIAGNOSIS — Z79899 Other long term (current) drug therapy: Secondary | ICD-10-CM | POA: Diagnosis not present

## 2022-11-18 DIAGNOSIS — Z5112 Encounter for antineoplastic immunotherapy: Secondary | ICD-10-CM | POA: Insufficient documentation

## 2022-11-18 MED ORDER — PROCHLORPERAZINE MALEATE 10 MG PO TABS: 10.0000 mg | ORAL_TABLET | Freq: Four times a day (QID) | ORAL | 1 refills | Status: DC | PRN

## 2022-11-18 NOTE — Progress Notes (Signed)
START ON PATHWAY REGIMEN - Hepatobiliary     A cycle is every 21 days:     Atezolizumab      Bevacizumab-xxxx   **Always confirm dose/schedule in your pharmacy ordering system**  Patient Characteristics: Hepatocellular Carcinoma, Unresectable or Nonsurgical Candidate, Locally Advanced or Metastatic Disease Not Amenable to Locoregional Therapy, Systemic Therapy, First Line, No Prior Transplant and Candidate for Immunotherapy Hepatobiliary Disease Type: Hepatocellular Carcinoma Line of Therapy: First Line Intent of Therapy: Non-Curative / Palliative Intent, Discussed with Patient

## 2022-11-18 NOTE — Progress Notes (Signed)
[name]  [phone] [phone]  ID: Aaron Lowery OB: 31-Mar-1950  MR#: [id number]  AOZ#:[id number]  Patient Care Team: [facility] as PCP - General Benita Gutter, RN as Oncology Nurse Navigator Orlie Dakin, Tollie Pizza, MD as Consulting Physician (Oncology)  CHIEF COMPLAINT: Stage IVa hepatocellular carcinoma.  INTERVAL HISTORY: Patient returns to clinic today for further evaluation, discussion of his pathology and imaging results, and treatment planning.  He continues to feel well and remains asymptomatic. He has no neurologic complaints.  He denies any recent fevers or illnesses.  He has a good appetite and denies any further weight loss.  He has no chest pain, shortness of breath, cough, or hemoptysis.  He denies any nausea, vomiting, constipation, or diarrhea.  He has no melena or hematochezia.  He denies any urinary complaints.  Patient offers no specific complaints today.  REVIEW OF SYSTEMS:   Review of Systems  Constitutional: Negative.  Negative for fever, malaise/fatigue and weight loss.  Respiratory: Negative.  Negative for cough, hemoptysis and shortness of breath.   Cardiovascular: Negative.  Negative for chest pain and leg swelling.  Gastrointestinal: Negative.  Negative for abdominal pain, blood in stool and melena.  Genitourinary: Negative.  Negative for dysuria.  Musculoskeletal: Negative.  Negative for back pain.  Skin: Negative.  Negative for rash.  Neurological: Negative.  Negative for dizziness, focal weakness, weakness and headaches.  Psychiatric/Behavioral: Negative.  The patient is not nervous/anxious.     As per HPI. Otherwise, a complete review of systems is negative.  PAST MEDICAL HISTORY: Past Medical History:  Diagnosis Date   Hypercholesteremia    Hypertension     PAST SURGICAL HISTORY: History reviewed. No pertinent surgical history.  FAMILY HISTORY: History reviewed. No pertinent family  history.  ADVANCED DIRECTIVES (Y/N):  N  HEALTH MAINTENANCE: Social History   Tobacco Use   Smoking status: Former    Types: Cigarettes   Smokeless tobacco: Never  Vaping Use   Vaping status: Never Used  Substance Use Topics   Alcohol use: Yes    Alcohol/week: 3.0 standard drinks of alcohol    Types: 3 Cans of beer per week   Drug use: Never     Colonoscopy:  PAP:  Bone density:  Lipid panel:  No Known Allergies  Current Outpatient Medications  Medication Sig Dispense Refill   ibuprofen (ADVIL) 800 MG tablet Take 800 mg by mouth every 6 (six) hours as needed.     levocetirizine (XYZAL) 5 MG tablet Take 5 mg by mouth every evening.     losartan (COZAAR) 50 MG tablet Take 50 mg by mouth 2 (two) times daily.     pravastatin (PRAVACHOL) 40 MG tablet Take 40 mg by mouth at bedtime.     prednisoLONE acetate (PRED FORTE) 1 % ophthalmic suspension Place 1 drop into the right eye every 4 (four) hours.     Travoprost, BAK Free, (TRAVATAN) 0.004 % SOLN ophthalmic solution Place 1 drop into both eyes at bedtime.     traZODone (DESYREL) 100 MG tablet Take 100 mg by mouth at bedtime.     trimethoprim-polymyxin b (POLYTRIM) ophthalmic solution Place 1 drop into the right eye every 4 (four) hours.     No current facility-administered medications for this visit.    OBJECTIVE: Vitals:   11/18/22 1026  BP: 118/86  Pulse: 84  Resp: 16  Temp: (!) 96 F (35.6 C)  SpO2: 100%     Body mass index is 28.63  kg/m.    ECOG FS:0 - Asymptomatic  General: Well-developed, well-nourished, no acute distress. Eyes: Pink conjunctiva, anicteric sclera. HEENT: Normocephalic, moist mucous membranes. Lungs: No audible wheezing or coughing. Heart: Regular rate and rhythm. Abdomen: Soft, nontender, no obvious distention. Musculoskeletal: No edema, cyanosis, or clubbing. Neuro: Alert, answering all questions appropriately. Cranial nerves grossly intact. Skin: No rashes or petechiae noted. Psych:  Normal affect.  LAB RESULTS:  Lab Results  Component Value Date   NA 136 11/05/2022   K 3.9 11/05/2022   CL 105 11/05/2022   CO2 23 11/05/2022   GLUCOSE 126 (H) 11/05/2022   BUN 17 11/05/2022   CREATININE 1.06 11/05/2022   CALCIUM 8.7 (L) 11/05/2022   PROT 7.5 11/05/2022   ALBUMIN 3.8 11/05/2022   AST 21 11/05/2022   ALT 18 11/05/2022   ALKPHOS 65 11/05/2022   BILITOT 0.5 11/05/2022   GFRNONAA >60 11/05/2022    Lab Results  Component Value Date   WBC 5.9 11/05/2022   NEUTROABS 4.9 10/25/2022   HGB 11.2 (L) 11/05/2022   HCT 34.9 (L) 11/05/2022   MCV 68.0 (L) 11/05/2022   PLT 326 11/05/2022     STUDIES: NM PET Image Initial (PI) Skull Base To Thigh  Result Date: 11/09/2022 CLINICAL DATA:  Initial treatment strategy for liver mass. Hepatocellular carcinoma on biopsy performed 11/05/2022. EXAM: NUCLEAR MEDICINE PET SKULL BASE TO THIGH TECHNIQUE: 9.83 mCi F-18 FDG was injected intravenously. Full-ring PET imaging was performed from the skull base to thigh after the radiotracer. CT data was obtained and used for attenuation correction and anatomic localization. Fasting blood glucose: 90 mg/dl COMPARISON:  Abdominal MRI 10/11/2022. Abdominopelvic CT 09/24/2022. Head CT 09/09/2021. FINDINGS: Mediastinal blood pool activity: SUV max 2.8 Liver activity: SUV max 3.7 NECK: No hypermetabolic cervical lymph nodes are identified.Fairly symmetric activity within the lymphoid tissue of Waldeyer's ring is within physiologic limits. No suspicious activity identified within the pharyngeal mucosal space. Incidental CT findings: As seen on previous head CT, there is an intrasellar mass with osseous remodeling and associated metabolic activity (SUV max 17.4). CHEST: There is low-level activity within small axillary lymph nodes bilaterally (SUV max 3.1 on the right). These are likely reactive. No hypermetabolic mediastinal or hilar lymph nodes. No hypermetabolic pulmonary activity or suspicious  nodularity. Incidental CT findings: Nonspecific 3 mm left upper lobe nodule on image 44/6. Mild atherosclerosis of the aorta, great vessels and coronary arteries. ABDOMEN/PELVIS: There is a 10th hypermetabolic activity within the previously demonstrated mass involving the dome of the right hepatic lobe. This lesion measures approximately 7.4 x 5.2 cm on image 56/6 and has an SUV max of 21.8. No other focal liver lesions are identified. There is no abnormal hypermetabolic activity within the spleen, adrenal glands or pancreas. There are several mildly hypermetabolic soft tissue nodules in the retroperitoneum which are probably lymph nodes. 3.6 x 1.4 cm soft tissue nodule superior to the left adrenal gland on image 72/6 is hypermetabolic with an SUV max of 6.9. This is more hypermetabolic than the spleen and is suspicious for a nodal metastasis. This is adjacent to the gastric cardia and could reflect a gastrointestinal stromal tumor. There are other smaller and less hypermetabolic lymph nodes, including a 1.3 cm portacaval node on image 80/6 (SUV max 5.2). No hypermetabolic adenopathy in the pelvis. Incidental CT findings: Mild aortic and branch vessel atherosclerosis. Small umbilical hernia containing only fat. SKELETON: There is no hypermetabolic activity to suggest osseous metastatic disease. Incidental CT findings: Mild spondylosis. IMPRESSION: 1.  Hypermetabolic mass involving the dome of the right hepatic lobe consistent with biopsy-proven hepatocellular carcinoma. 2. Hypermetabolic soft tissue nodule superior to the left adrenal gland, suspicious for a nodal metastasis. This is adjacent to the gastric cardia and could reflect a gastrointestinal stromal tumor. 3. Other smaller and less hypermetabolic lymph nodes in the upper abdomen are nonspecific, but could be metastatic. 4. No evidence of metastatic disease in the chest or pelvis. 5. Hypermetabolic intrasellar mass with osseous remodeling, most consistent  with a pituitary adenoma. Appearance is similar to previous head CT from 09/09/2021. If not previously performed, further evaluation with pituitary protocol brain MRI (without and with contrast) recommended. 6.  Aortic Atherosclerosis (ICD10-I70.0). Electronically Signed   By: Carey Bullocks M.D.   On: 11/09/2022 14:32   US BIOPSY (LIVER)  Result Date: 11/05/2022 INDICATION: 72 year old male with liver mass, referred for biopsy EXAM: ULTRASOUND-GUIDED LIVER MASS BIOPSY MEDICATIONS: None. ANESTHESIA/SEDATION: Moderate (conscious) sedation was employed during this procedure. A total of Versed 1.0 mg and Fentanyl 50 mcg was administered intravenously by the radiology nurse. Total intra-service moderate Sedation Time: 12 minutes. The patient's level of consciousness and vital signs were monitored continuously by radiology nursing throughout the procedure under my direct supervision. COMPLICATIONS: None PROCEDURE: Informed written consent was obtained from the patient after a thorough discussion of the procedural risks, benefits and alternatives. All questions were addressed. Maximal Sterile Barrier Technique was utilized including caps, mask, sterile gowns, sterile gloves, sterile drape, hand hygiene and skin antiseptic. A timeout was performed prior to the initiation of the procedure. Ultrasound survey of the right liver lobe performed with images stored and sent to PACs. The right lower thorax/right upper abdomen was prepped with chlorhexidine in a sterile fashion, and a sterile drape was applied covering the operative field. A sterile gown and sterile gloves were used for the procedure. Local anesthesia was provided with 1% Lidocaine. The patient was prepped and draped sterilely and the skin and subcutaneous tissues were generously infiltrated with 1% lidocaine. A 17 gauge introducer needle was then advanced under ultrasound guidance in an intercostal location into the right liver lobe, targeting the right  liver mass. The stylet was removed, and multiple separate 18 gauge core biopsy were retrieved. Samples were placed into formalin for transportation to the lab. Gel-Foam pledgets were then infused with a small amount of saline for assistance with hemostasis. The needle was removed, and a final ultrasound image was performed. The patient tolerated the procedure well and remained hemodynamically stable throughout. No complications were encountered and no significant blood loss was encounter. IMPRESSION: Status post ultrasound-guided liver mass biopsy. Signed, Yvone Neu. Miachel Roux, RPVI [facility] Electronically Signed   By: Gilmer Mor D.O.   On: 11/05/2022 11:29    ASSESSMENT: Stage IVa hepatocellular carcinoma.  PLAN:    Stage IVa hepatocellular carcinoma: Diagnosis confirmed by liver biopsy on November 05, 2022.  PET scan results from November 01, 2022 reviewed independently and reported as above with 7.8 cm hypermetabolic liver mass as well as a hypermetabolic lymph node suspicious for metastasis. AFP is only mildly elevated at 38.9.  Case discussed at tumor board and while patient is likely not a transplant candidate he may be a candidate for direct ablation if lesion can be decreased in size.  Patient has agreed to systemic treatment using Tecentriq and Avastin every 3 weeks.  Will reimage after 3 to 4 months.  Return to clinic in 1 week to initiate  cycle 1. Anemia: Mild.  I spent a total of 30 minutes reviewing chart data, face-to-face evaluation with the patient, counseling and coordination of care as detailed above.   Patient expressed understanding and was in agreement with this plan. He also understands that He can call clinic at any time with any questions, concerns, or complaints.    Cancer Staging  Hepatocellular carcinoma [facility]) Staging form: Liver, AJCC 8th Edition - Clinical stage from 11/18/2022: Stage IVA (cT1b, cN1, cM0)  - Signed by Jeralyn Ruths, MD on 11/18/2022 Stage prefix: Initial diagnosis   Jeralyn Ruths, MD   11/18/2022 1:28 PM

## 2022-11-19 ENCOUNTER — Other Ambulatory Visit: Payer: Self-pay | Admitting: Oncology

## 2022-11-19 ENCOUNTER — Other Ambulatory Visit: Payer: Self-pay

## 2022-11-19 ENCOUNTER — Encounter: Payer: Self-pay | Admitting: Oncology

## 2022-11-21 ENCOUNTER — Other Ambulatory Visit: Payer: Self-pay

## 2022-11-22 NOTE — Progress Notes (Signed)
Pharmacist Chemotherapy Monitoring - Initial Assessment    Anticipated start date: 11/26/22   The following has been reviewed per standard work regarding the patient's treatment regimen: The patient's diagnosis, treatment plan and drug doses, and organ/hematologic function Lab orders and baseline tests specific to treatment regimen  The treatment plan start date, drug sequencing, and pre-medications Prior authorization status  Patient's documented medication list, including drug-drug interaction screen and prescriptions for anti-emetics and supportive care specific to the treatment regimen The drug concentrations, fluid compatibility, administration routes, and timing of the medications to be used The patient's access for treatment and lifetime cumulative dose history, if applicable  The patient's medication allergies and previous infusion related reactions, if applicable   Changes made to treatment plan:  N/A  Follow up needed:  N/A   Ebony Hail, Pharm.D., CPP 11/22/2022@4 :01 PM

## 2022-11-23 ENCOUNTER — Inpatient Hospital Stay: Payer: 59

## 2022-11-26 ENCOUNTER — Telehealth: Payer: Self-pay | Admitting: Oncology

## 2022-11-26 ENCOUNTER — Inpatient Hospital Stay: Payer: 59

## 2022-11-26 ENCOUNTER — Encounter: Payer: Self-pay | Admitting: Oncology

## 2022-11-26 ENCOUNTER — Other Ambulatory Visit: Payer: Self-pay

## 2022-11-26 ENCOUNTER — Inpatient Hospital Stay (HOSPITAL_BASED_OUTPATIENT_CLINIC_OR_DEPARTMENT_OTHER): Payer: 59 | Admitting: Oncology

## 2022-11-26 VITALS — BP 133/85 | HR 78 | Temp 97.8°F | Resp 16 | Ht 67.0 in | Wt 183.5 lb

## 2022-11-26 VITALS — BP 158/84 | HR 82

## 2022-11-26 DIAGNOSIS — C220 Liver cell carcinoma: Secondary | ICD-10-CM

## 2022-11-26 DIAGNOSIS — Z5112 Encounter for antineoplastic immunotherapy: Secondary | ICD-10-CM | POA: Diagnosis not present

## 2022-11-26 LAB — CBC WITH DIFFERENTIAL (CANCER CENTER ONLY)
Abs Immature Granulocytes: 0.02 10*3/uL (ref 0.00–0.07)
Basophils Absolute: 0 10*3/uL (ref 0.0–0.1)
Basophils Relative: 0 %
Eosinophils Absolute: 0.3 10*3/uL (ref 0.0–0.5)
Eosinophils Relative: 5 %
HCT: 35.6 % — ABNORMAL LOW (ref 39.0–52.0)
Hemoglobin: 11 g/dL — ABNORMAL LOW (ref 13.0–17.0)
Immature Granulocytes: 0 %
Lymphocytes Relative: 30 %
Lymphs Abs: 1.9 10*3/uL (ref 0.7–4.0)
MCH: 21.9 pg — ABNORMAL LOW (ref 26.0–34.0)
MCHC: 30.9 g/dL (ref 30.0–36.0)
MCV: 70.9 fL — ABNORMAL LOW (ref 80.0–100.0)
Monocytes Absolute: 0.5 10*3/uL (ref 0.1–1.0)
Monocytes Relative: 8 %
Neutro Abs: 3.5 10*3/uL (ref 1.7–7.7)
Neutrophils Relative %: 57 %
Platelet Count: 352 10*3/uL (ref 150–400)
RBC: 5.02 MIL/uL (ref 4.22–5.81)
RDW: 15.2 % (ref 11.5–15.5)
WBC Count: 6.2 10*3/uL (ref 4.0–10.5)
nRBC: 0 % (ref 0.0–0.2)

## 2022-11-26 LAB — CMP (CANCER CENTER ONLY)
ALT: 15 U/L (ref 0–44)
AST: 20 U/L (ref 15–41)
Albumin: 4.1 g/dL (ref 3.5–5.0)
Alkaline Phosphatase: 64 U/L (ref 38–126)
Anion gap: 8 (ref 5–15)
BUN: 17 mg/dL (ref 8–23)
CO2: 26 mmol/L (ref 22–32)
Calcium: 9.3 mg/dL (ref 8.9–10.3)
Chloride: 102 mmol/L (ref 98–111)
Creatinine: 1.21 mg/dL (ref 0.61–1.24)
GFR, Estimated: >60 mL/min (ref >60)
Glucose, Bld: 143 mg/dL — ABNORMAL HIGH (ref 70–99)
Potassium: 4.4 mmol/L (ref 3.5–5.1)
Sodium: 136 mmol/L (ref 135–145)
Total Bilirubin: 0.7 mg/dL (ref <1.2)
Total Protein: 7.6 g/dL (ref 6.5–8.1)

## 2022-11-26 LAB — TSH: TSH: 2.631 u[IU]/mL (ref 0.350–4.500)

## 2022-11-26 LAB — TOTAL PROTEIN, URINE DIPSTICK: Protein, ur: NEGATIVE mg/dL

## 2022-11-26 MED ORDER — ATEZOLIZUMAB CHEMO INJECTION 1200 MG/20ML
1200.0000 mg | Freq: Once | INTRAVENOUS | Status: AC
  Administered 2022-11-26: 1200 mg via INTRAVENOUS
  Filled 2022-11-26: qty 20

## 2022-11-26 MED ORDER — SODIUM CHLORIDE 0.9 % IV SOLN
INTRAVENOUS | Status: DC
  Filled 2022-11-26: qty 250

## 2022-11-26 MED ORDER — SODIUM CHLORIDE 0.9 % IV SOLN
15.0000 mg/kg | Freq: Once | INTRAVENOUS | Status: AC
  Administered 2022-11-26: 1200 mg via INTRAVENOUS
  Filled 2022-11-26: qty 48

## 2022-11-26 NOTE — Progress Notes (Signed)
Wants to know if it is ok to use herbal supplements while on treatment. He takes things for digestive system.

## 2022-11-26 NOTE — Telephone Encounter (Signed)
Pt came up to scheduling desk and stated he wants to change his appts from Fridays to "wed, Thursday, or any day not Friday" as he wants to leave town sometimes.   Since the appts are from [facility], the dates will have to be changed by MD and then can be r/s accordingly. Please let pt know when all is updated. Thank you

## 2022-11-26 NOTE — Progress Notes (Signed)
[facility]  Telephone:(336) [phone] [phone]) [phone]  ID: Aaron Lowery OB: [birth year]  MR#: [id number]  EXB#:[id number]  Patient Care Team: [facility] as PCP - General Benita Gutter, RN as Oncology Nurse Navigator Orlie Dakin, Tollie Pizza, MD as Consulting Physician (Oncology)  CHIEF COMPLAINT: Stage IVa hepatocellular carcinoma.  INTERVAL HISTORY: Patient returns to clinic today for further evaluation and initiation of cycle 1 of Tecentriq and Avastin.  He continues to feel well and remains asymptomatic.  He has no neurologic complaints.  He denies any recent fevers or illnesses.  He has a good appetite and denies any further weight loss.  He has no chest pain, shortness of breath, cough, or hemoptysis.  He denies any nausea, vomiting, constipation, or diarrhea.  He has no melena or hematochezia.  He denies any urinary complaints.  Patient offers no specific complaints today.  REVIEW OF SYSTEMS:   Review of Systems  Constitutional: Negative.  Negative for fever, malaise/fatigue and weight loss.  Respiratory: Negative.  Negative for cough, hemoptysis and shortness of breath.   Cardiovascular: Negative.  Negative for chest pain and leg swelling.  Gastrointestinal: Negative.  Negative for abdominal pain, blood in stool and melena.  Genitourinary: Negative.  Negative for dysuria.  Musculoskeletal: Negative.  Negative for back pain.  Skin: Negative.  Negative for rash.  Neurological: Negative.  Negative for dizziness, focal weakness, weakness and headaches.  Psychiatric/Behavioral: Negative.  The patient is not nervous/anxious.     As per HPI. Otherwise, a complete review of systems is negative.  PAST MEDICAL HISTORY: Past Medical History:  Diagnosis Date   Hypercholesteremia    Hypertension     PAST SURGICAL HISTORY: History reviewed. No pertinent surgical history.  FAMILY HISTORY: History reviewed. No pertinent family history.  ADVANCED  DIRECTIVES (Y/N):  N  HEALTH MAINTENANCE: Social History   Tobacco Use   Smoking status: Former    Types: Cigarettes   Smokeless tobacco: Never  Vaping Use   Vaping status: Never Used  Substance Use Topics   Alcohol use: Yes    Alcohol/week: 3.0 standard drinks of alcohol    Types: 3 Cans of beer per week   Drug use: Never     Colonoscopy:  PAP:  Bone density:  Lipid panel:  No Known Allergies  Current Outpatient Medications  Medication Sig Dispense Refill   ibuprofen (ADVIL) 800 MG tablet Take 800 mg by mouth every 6 (six) hours as needed.     levocetirizine (XYZAL) 5 MG tablet Take 5 mg by mouth every evening.     losartan (COZAAR) 50 MG tablet Take 50 mg by mouth 2 (two) times daily.     pravastatin (PRAVACHOL) 40 MG tablet Take 40 mg by mouth at bedtime.     prednisoLONE acetate (PRED FORTE) 1 % ophthalmic suspension Place 1 drop into the right eye every 4 (four) hours.     prochlorperazine (COMPAZINE) 10 MG tablet Take 1 tablet (10 mg total) by mouth every 6 (six) hours as needed for nausea or vomiting. 60 tablet 1   Travoprost, BAK Free, (TRAVATAN) 0.004 % SOLN ophthalmic solution Place 1 drop into both eyes at bedtime.     traZODone (DESYREL) 100 MG tablet Take 100 mg by mouth at bedtime.     trimethoprim-polymyxin b (POLYTRIM) ophthalmic solution Place 1 drop into the right eye every 4 (four) hours.     No current facility-administered medications for this visit.    OBJECTIVE: Vitals:   11/26/22 0908  BP: 133/85  Pulse: 78  Resp: 16  Temp: 97.8 F (36.6 C)  SpO2: 99%     Body mass index is 28.74 kg/m.    ECOG FS:0 - Asymptomatic  General: Well-developed, well-nourished, no acute distress. Eyes: Pink conjunctiva, anicteric sclera. HEENT: Normocephalic, moist mucous membranes. Lungs: No audible wheezing or coughing. Heart: Regular rate and rhythm. Abdomen: Soft, nontender, no obvious distention. Musculoskeletal: No edema, cyanosis, or clubbing. Neuro:  Alert, answering all questions appropriately. Cranial nerves grossly intact. Skin: No rashes or petechiae noted. Psych: Normal affect.  LAB RESULTS:  Lab Results  Component Value Date   NA 136 11/26/2022   K 4.4 11/26/2022   CL 102 11/26/2022   CO2 26 11/26/2022   GLUCOSE 143 (H) 11/26/2022   BUN 17 11/26/2022   CREATININE 1.21 11/26/2022   CALCIUM 9.3 11/26/2022   PROT 7.6 11/26/2022   ALBUMIN 4.1 11/26/2022   AST 20 11/26/2022   ALT 15 11/26/2022   ALKPHOS 64 11/26/2022   BILITOT 0.7 11/26/2022   GFRNONAA >60 11/26/2022    Lab Results  Component Value Date   WBC 6.2 11/26/2022   NEUTROABS 3.5 11/26/2022   HGB 11.0 (L) 11/26/2022   HCT 35.6 (L) 11/26/2022   MCV 70.9 (L) 11/26/2022   PLT 352 11/26/2022     STUDIES: NM PET Image Initial (PI) Skull Base To Thigh  Result Date: 11/09/2022 CLINICAL DATA:  Initial treatment strategy for liver mass. Hepatocellular carcinoma on biopsy performed 11/05/2022. EXAM: NUCLEAR MEDICINE PET SKULL BASE TO THIGH TECHNIQUE: 9.83 mCi F-18 FDG was injected intravenously. Full-ring PET imaging was performed from the skull base to thigh after the radiotracer. CT data was obtained and used for attenuation correction and anatomic localization. Fasting blood glucose: 90 mg/dl COMPARISON:  Abdominal MRI 10/11/2022. Abdominopelvic CT 09/24/2022. Head CT 09/09/2021. FINDINGS: Mediastinal blood pool activity: SUV max 2.8 Liver activity: SUV max 3.7 NECK: No hypermetabolic cervical lymph nodes are identified.Fairly symmetric activity within the lymphoid tissue of Waldeyer's ring is within physiologic limits. No suspicious activity identified within the pharyngeal mucosal space. Incidental CT findings: As seen on previous head CT, there is an intrasellar mass with osseous remodeling and associated metabolic activity (SUV max 17.4). CHEST: There is low-level activity within small axillary lymph nodes bilaterally (SUV max 3.1 on the right). These are likely  reactive. No hypermetabolic mediastinal or hilar lymph nodes. No hypermetabolic pulmonary activity or suspicious nodularity. Incidental CT findings: Nonspecific 3 mm left upper lobe nodule on image 44/6. Mild atherosclerosis of the aorta, great vessels and coronary arteries. ABDOMEN/PELVIS: There is a 10th hypermetabolic activity within the previously demonstrated mass involving the dome of the right hepatic lobe. This lesion measures approximately 7.4 x 5.2 cm on image 56/6 and has an SUV max of 21.8. No other focal liver lesions are identified. There is no abnormal hypermetabolic activity within the spleen, adrenal glands or pancreas. There are several mildly hypermetabolic soft tissue nodules in the retroperitoneum which are probably lymph nodes. 3.6 x 1.4 cm soft tissue nodule superior to the left adrenal gland on image 72/6 is hypermetabolic with an SUV max of 6.9. This is more hypermetabolic than the spleen and is suspicious for a nodal metastasis. This is adjacent to the gastric cardia and could reflect a gastrointestinal stromal tumor. There are other smaller and less hypermetabolic lymph nodes, including a 1.3 cm portacaval node on image 80/6 (SUV max 5.2). No hypermetabolic adenopathy in the pelvis. Incidental CT findings: Mild aortic and branch vessel  atherosclerosis. Small umbilical hernia containing only fat. SKELETON: There is no hypermetabolic activity to suggest osseous metastatic disease. Incidental CT findings: Mild spondylosis. IMPRESSION: 1. Hypermetabolic mass involving the dome of the right hepatic lobe consistent with biopsy-proven hepatocellular carcinoma. 2. Hypermetabolic soft tissue nodule superior to the left adrenal gland, suspicious for a nodal metastasis. This is adjacent to the gastric cardia and could reflect a gastrointestinal stromal tumor. 3. Other smaller and less hypermetabolic lymph nodes in the upper abdomen are nonspecific, but could be metastatic. 4. No evidence of  metastatic disease in the chest or pelvis. 5. Hypermetabolic intrasellar mass with osseous remodeling, most consistent with a pituitary adenoma. Appearance is similar to previous head CT from 09/09/2021. If not previously performed, further evaluation with pituitary protocol brain MRI (without and with contrast) recommended. 6.  Aortic Atherosclerosis (ICD10-I70.0). Electronically Signed   By: Carey Bullocks M.D.   On: 11/09/2022 14:32   US BIOPSY (LIVER)  Result Date: 11/05/2022 INDICATION: 72 year old male with liver mass, referred for biopsy EXAM: ULTRASOUND-GUIDED LIVER MASS BIOPSY MEDICATIONS: None. ANESTHESIA/SEDATION: Moderate (conscious) sedation was employed during this procedure. A total of Versed 1.0 mg and Fentanyl 50 mcg was administered intravenously by the radiology nurse. Total intra-service moderate Sedation Time: 12 minutes. The patient's level of consciousness and vital signs were monitored continuously by radiology nursing throughout the procedure under my direct supervision. COMPLICATIONS: None PROCEDURE: Informed written consent was obtained from the patient after a thorough discussion of the procedural risks, benefits and alternatives. All questions were addressed. Maximal Sterile Barrier Technique was utilized including caps, mask, sterile gowns, sterile gloves, sterile drape, hand hygiene and skin antiseptic. A timeout was performed prior to the initiation of the procedure. Ultrasound survey of the right liver lobe performed with images stored and sent to PACs. The right lower thorax/right upper abdomen was prepped with chlorhexidine in a sterile fashion, and a sterile drape was applied covering the operative field. A sterile gown and sterile gloves were used for the procedure. Local anesthesia was provided with 1% Lidocaine. The patient was prepped and draped sterilely and the skin and subcutaneous tissues were generously infiltrated with 1% lidocaine. A 17 gauge introducer needle was  then advanced under ultrasound guidance in an intercostal location into the right liver lobe, targeting the right liver mass. The stylet was removed, and multiple separate 18 gauge core biopsy were retrieved. Samples were placed into formalin for transportation to the lab. Gel-Foam pledgets were then infused with a small amount of saline for assistance with hemostasis. The needle was removed, and a final ultrasound image was performed. The patient tolerated the procedure well and remained hemodynamically stable throughout. No complications were encountered and no significant blood loss was encounter. IMPRESSION: Status post ultrasound-guided liver mass biopsy. Signed, Yvone Neu. Miachel Roux, RPVI [facility] Electronically Signed   By: Gilmer Mor D.O.   On: 11/05/2022 11:29    ASSESSMENT: Stage IVa hepatocellular carcinoma.  PLAN:    Stage IVa hepatocellular carcinoma: Diagnosis confirmed by liver biopsy on November 05, 2022.  PET scan results from November 01, 2022 reviewed independently and reported as above with 7.8 cm hypermetabolic liver mass as well as a hypermetabolic lymph node suspicious for metastasis. AFP is only mildly elevated at 38.9.  Case discussed at tumor board and while patient is likely not a transplant candidate he may be a candidate for direct ablation if lesion can be decreased in size.  Patient has agreed to systemic  treatment using Tecentriq and Avastin every 3 weeks.  Proceed with cycle 1 of treatment today.  Return to clinic in 3 weeks for further evaluation and consideration of cycle 2.  Consider reimaging in March 2025.   Anemia: Mild.  Patient's hemoglobin is 11.0.  Monitor.  I spent a total of 30 minutes reviewing chart data, face-to-face evaluation with the patient, counseling and coordination of care as detailed above.   Patient expressed understanding and was in agreement with this plan. He also understands  that He can call clinic at any time with any questions, concerns, or complaints.    Cancer Staging  Hepatocellular carcinoma [facility]) Staging form: Liver, AJCC 8th Edition - Clinical stage from 11/18/2022: Stage IVA (cT1b, cN1, cM0) - Signed by Jeralyn Ruths, MD on 11/18/2022 Stage prefix: Initial diagnosis   Jeralyn Ruths, MD   11/26/2022 9:36 AM

## 2022-11-26 NOTE — Patient Instructions (Addendum)
[facility] - A DEPT OF [facility]  Discharge Instructions: Thank you for choosing [facility] to provide your oncology and hematology care.  If you have a lab appointment with the Cancer Center, please go directly to the Cancer Center and check in at the registration area.  Wear comfortable clothing and clothing appropriate for easy access to any Portacath or PICC line.   We strive to give you quality time with your provider. You may need to reschedule your appointment if you arrive late (15 or more minutes).  Arriving late affects you and other patients whose appointments are after yours.  Also, if you miss three or more appointments without notifying the office, you may be dismissed from the clinic at the provider's discretion.      For prescription refill requests, have your pharmacy contact our office and allow 72 hours for refills to be completed.     To help prevent nausea and vomiting after your treatment, we encourage you to take your nausea medication as directed.  BELOW ARE SYMPTOMS THAT SHOULD BE REPORTED IMMEDIATELY: *FEVER GREATER THAN 100.4 F (38 C) OR HIGHER *CHILLS OR SWEATING *NAUSEA AND VOMITING THAT IS NOT CONTROLLED WITH YOUR NAUSEA MEDICATION *UNUSUAL SHORTNESS OF BREATH *UNUSUAL BRUISING OR BLEEDING *URINARY PROBLEMS (pain or burning when urinating, or frequent urination) *BOWEL PROBLEMS (unusual diarrhea, constipation, pain near the anus) TENDERNESS IN MOUTH AND THROAT WITH OR WITHOUT PRESENCE OF ULCERS (sore throat, sores in mouth, or a toothache) UNUSUAL RASH, SWELLING OR PAIN   Items with * indicate a potential emergency and should be followed up as soon as possible or go to the Emergency Department if any problems should occur.  Please show the CHEMOTHERAPY ALERT CARD or IMMUNOTHERAPY ALERT CARD at check-in to the Emergency Department and triage nurse.  Should you have questions after your visit or need to cancel  or reschedule your appointment, please contact [facility] - A DEPT OF [facility]  [phone] and follow the prompts.  Office hours are 8:00 a.m. to 4:30 p.m. Monday - Friday. Please note that voicemails left after 4:00 p.m. may not be returned until the following business day.  We are closed weekends and major holidays. You have access to a nurse at all times for urgent questions. Please call the main number to the clinic [phone] and follow the prompts.  For any non-urgent questions, you may also contact your provider using MyChart. We now offer e-Visits for anyone 27 and older to request care online for non-urgent symptoms. For details visit [email or web address].   Also download the MyChart app! Go to the app store, search "MyChart", open the app, select [place], and log in with your MyChart username and password.

## 2022-11-27 LAB — T4: T4, Total: 6.3 ug/dL (ref 4.5–12.0)

## 2022-11-29 ENCOUNTER — Telehealth: Payer: Self-pay

## 2022-11-29 NOTE — Telephone Encounter (Signed)
Telephone call to patient for follow up after receiving first infusion.   Patient states infusion went great.  States eating good and drinking plenty of fluids.   Denies any nausea or vomiting.  Encouraged patient to call for any concerns or questions. 

## 2022-12-13 ENCOUNTER — Encounter: Payer: Self-pay | Admitting: Oncology

## 2022-12-16 ENCOUNTER — Other Ambulatory Visit: Payer: 59

## 2022-12-16 ENCOUNTER — Inpatient Hospital Stay: Payer: 59 | Admitting: Oncology

## 2022-12-16 ENCOUNTER — Encounter: Payer: Self-pay | Admitting: Oncology

## 2022-12-16 ENCOUNTER — Ambulatory Visit: Payer: 59 | Admitting: Oncology

## 2022-12-16 ENCOUNTER — Inpatient Hospital Stay: Payer: 59

## 2022-12-16 ENCOUNTER — Inpatient Hospital Stay: Payer: 59 | Attending: Oncology

## 2022-12-16 ENCOUNTER — Ambulatory Visit: Payer: 59

## 2022-12-16 VITALS — BP 146/89 | HR 79 | Temp 96.0°F | Resp 16 | Ht 67.0 in | Wt 185.0 lb

## 2022-12-16 DIAGNOSIS — C220 Liver cell carcinoma: Secondary | ICD-10-CM

## 2022-12-16 DIAGNOSIS — Z79899 Other long term (current) drug therapy: Secondary | ICD-10-CM | POA: Diagnosis not present

## 2022-12-16 DIAGNOSIS — Z5112 Encounter for antineoplastic immunotherapy: Secondary | ICD-10-CM | POA: Diagnosis present

## 2022-12-16 DIAGNOSIS — I10 Essential (primary) hypertension: Secondary | ICD-10-CM | POA: Diagnosis not present

## 2022-12-16 DIAGNOSIS — D649 Anemia, unspecified: Secondary | ICD-10-CM | POA: Insufficient documentation

## 2022-12-16 DIAGNOSIS — Z87891 Personal history of nicotine dependence: Secondary | ICD-10-CM | POA: Diagnosis not present

## 2022-12-16 LAB — CBC WITH DIFFERENTIAL (CANCER CENTER ONLY)
Abs Immature Granulocytes: 0.01 10*3/uL (ref 0.00–0.07)
Basophils Absolute: 0 10*3/uL (ref 0.0–0.1)
Basophils Relative: 0 %
Eosinophils Absolute: 0.4 10*3/uL (ref 0.0–0.5)
Eosinophils Relative: 7 %
HCT: 37 % — ABNORMAL LOW (ref 39.0–52.0)
Hemoglobin: 11.5 g/dL — ABNORMAL LOW (ref 13.0–17.0)
Immature Granulocytes: 0 %
Lymphocytes Relative: 40 %
Lymphs Abs: 2.5 10*3/uL (ref 0.7–4.0)
MCH: 21.8 pg — ABNORMAL LOW (ref 26.0–34.0)
MCHC: 31.1 g/dL (ref 30.0–36.0)
MCV: 70.1 fL — ABNORMAL LOW (ref 80.0–100.0)
Monocytes Absolute: 0.5 10*3/uL (ref 0.1–1.0)
Monocytes Relative: 8 %
Neutro Abs: 2.8 10*3/uL (ref 1.7–7.7)
Neutrophils Relative %: 45 %
Platelet Count: 254 10*3/uL (ref 150–400)
RBC: 5.28 MIL/uL (ref 4.22–5.81)
RDW: 15.8 % — ABNORMAL HIGH (ref 11.5–15.5)
WBC Count: 6.3 10*3/uL (ref 4.0–10.5)
nRBC: 0 % (ref 0.0–0.2)

## 2022-12-16 LAB — PROTEIN, URINE, RANDOM: Total Protein, Urine: 17 mg/dL

## 2022-12-16 LAB — CMP (CANCER CENTER ONLY)
ALT: 16 U/L (ref 0–44)
AST: 21 U/L (ref 15–41)
Albumin: 3.9 g/dL (ref 3.5–5.0)
Alkaline Phosphatase: 58 U/L (ref 38–126)
Anion gap: 5 (ref 5–15)
BUN: 16 mg/dL (ref 8–23)
CO2: 26 mmol/L (ref 22–32)
Calcium: 8.9 mg/dL (ref 8.9–10.3)
Chloride: 107 mmol/L (ref 98–111)
Creatinine: 1.09 mg/dL (ref 0.61–1.24)
GFR, Estimated: >60 mL/min (ref >60)
Glucose, Bld: 146 mg/dL — ABNORMAL HIGH (ref 70–99)
Potassium: 3.9 mmol/L (ref 3.5–5.1)
Sodium: 138 mmol/L (ref 135–145)
Total Bilirubin: 0.3 mg/dL (ref <1.2)
Total Protein: 7.6 g/dL (ref 6.5–8.1)

## 2022-12-16 MED ORDER — SODIUM CHLORIDE 0.9 % IV SOLN
1200.0000 mg | Freq: Once | INTRAVENOUS | Status: AC
  Administered 2022-12-16: 1200 mg via INTRAVENOUS
  Filled 2022-12-16: qty 20

## 2022-12-16 MED ORDER — SODIUM CHLORIDE 0.9 % IV SOLN
15.0000 mg/kg | Freq: Once | INTRAVENOUS | Status: AC
  Administered 2022-12-16: 1200 mg via INTRAVENOUS
  Filled 2022-12-16: qty 48

## 2022-12-16 MED ORDER — SODIUM CHLORIDE 0.9 % IV SOLN
INTRAVENOUS | Status: DC
  Filled 2022-12-16: qty 250

## 2022-12-16 NOTE — Progress Notes (Signed)
Mentioned concerns about body/joint pain and did not know if that is a side effect of treatment.

## 2022-12-16 NOTE — Progress Notes (Signed)
[facility]  Telephone:(336) [phone] Fax:(336) [phone]  ID: Aaron Lowery OB: 13-Feb-1950  MR#: [id number]  GMW#:[id number]  Patient Care Team: [facility] as PCP - General Benita Gutter, RN as Oncology Nurse Navigator Orlie Dakin, Tollie Pizza, MD as Consulting Physician (Oncology)  CHIEF COMPLAINT: Stage IVa hepatocellular carcinoma.  INTERVAL HISTORY: Patient returns to clinic today for further evaluation and consideration of cycle 2 of Tecentriq and Avastin.  He noted some increase in joint pain, but otherwise tolerated his treatment well without significant side effects.  He currently feels well and is asymptomatic. He has no neurologic complaints.  He denies any recent fevers or illnesses.  He has a good appetite and denies any further weight loss.  He has no chest pain, shortness of breath, cough, or hemoptysis.  He denies any nausea, vomiting, constipation, or diarrhea.  He has no melena or hematochezia.  He denies any urinary complaints.  Patient offers no further specific complaints today.  REVIEW OF SYSTEMS:   Review of Systems  Constitutional: Negative.  Negative for fever, malaise/fatigue and weight loss.  Respiratory: Negative.  Negative for cough, hemoptysis and shortness of breath.   Cardiovascular: Negative.  Negative for chest pain and leg swelling.  Gastrointestinal: Negative.  Negative for abdominal pain, blood in stool and melena.  Genitourinary: Negative.  Negative for dysuria.  Musculoskeletal:  Positive for joint pain. Negative for back pain.  Skin: Negative.  Negative for rash.  Neurological: Negative.  Negative for dizziness, focal weakness, weakness and headaches.  Psychiatric/Behavioral: Negative.  The patient is not nervous/anxious.     As per HPI. Otherwise, a complete review of systems is negative.  PAST MEDICAL HISTORY: Past Medical History:  Diagnosis Date   Hypercholesteremia    Hypertension     PAST SURGICAL  HISTORY: History reviewed. No pertinent surgical history.  FAMILY HISTORY: History reviewed. No pertinent family history.  ADVANCED DIRECTIVES (Y/N):  N  HEALTH MAINTENANCE: Social History   Tobacco Use   Smoking status: Former    Types: Cigarettes   Smokeless tobacco: Never  Vaping Use   Vaping status: Never Used  Substance Use Topics   Alcohol use: Yes    Alcohol/week: 3.0 standard drinks of alcohol    Types: 3 Cans of beer per week   Drug use: Never     Colonoscopy:  PAP:  Bone density:  Lipid panel:  No Known Allergies  Current Outpatient Medications  Medication Sig Dispense Refill   ibuprofen (ADVIL) 800 MG tablet Take 800 mg by mouth every 6 (six) hours as needed.     levocetirizine (XYZAL) 5 MG tablet Take 5 mg by mouth every evening.     losartan (COZAAR) 50 MG tablet Take 50 mg by mouth 2 (two) times daily.     pravastatin (PRAVACHOL) 40 MG tablet Take 40 mg by mouth at bedtime.     prednisoLONE acetate (PRED FORTE) 1 % ophthalmic suspension Place 1 drop into the right eye every 4 (four) hours.     prochlorperazine (COMPAZINE) 10 MG tablet Take 1 tablet (10 mg total) by mouth every 6 (six) hours as needed for nausea or vomiting. 60 tablet 1   Travoprost, BAK Free, (TRAVATAN) 0.004 % SOLN ophthalmic solution Place 1 drop into both eyes at bedtime.     traZODone (DESYREL) 100 MG tablet Take 100 mg by mouth at bedtime.     trimethoprim-polymyxin b (POLYTRIM) ophthalmic solution Place 1 drop into the right eye every 4 (four) hours.  No current facility-administered medications for this visit.   Facility-Administered Medications Ordered in Other Visits  Medication Dose Route Frequency Provider Last Rate Last Admin   0.9 %  sodium chloride infusion   Intravenous Continuous Tori Dattilio, Tollie Pizza, MD       atezolizumab [facility]) 1,200 mg in sodium chloride 0.9 % 250 mL chemo infusion  1,200 mg Intravenous Once Jeralyn Ruths, MD       bevacizumab-awwb (MVASI)  1,200 mg in sodium chloride 0.9 % 100 mL chemo infusion  15 mg/kg (Treatment Plan Recorded) Intravenous Once Jeralyn Ruths, MD        OBJECTIVE: Vitals:   12/16/22 1036  BP: (!) 146/89  Pulse: 79  Resp: 16  Temp: (!) 96 F (35.6 C)  SpO2: 98%     Body mass index is 28.98 kg/m.    ECOG FS:0 - Asymptomatic  General: Well-developed, well-nourished, no acute distress. Eyes: Pink conjunctiva, anicteric sclera. HEENT: Normocephalic, moist mucous membranes. Lungs: No audible wheezing or coughing. Heart: Regular rate and rhythm. Abdomen: Soft, nontender, no obvious distention. Musculoskeletal: No edema, cyanosis, or clubbing. Neuro: Alert, answering all questions appropriately. Cranial nerves grossly intact. Skin: No rashes or petechiae noted. Psych: Normal affect.  LAB RESULTS:  Lab Results  Component Value Date   NA 138 12/16/2022   K 3.9 12/16/2022   CL 107 12/16/2022   CO2 26 12/16/2022   GLUCOSE 146 (H) 12/16/2022   BUN 16 12/16/2022   CREATININE 1.09 12/16/2022   CALCIUM 8.9 12/16/2022   PROT 7.6 12/16/2022   ALBUMIN 3.9 12/16/2022   AST 21 12/16/2022   ALT 16 12/16/2022   ALKPHOS 58 12/16/2022   BILITOT 0.3 12/16/2022   GFRNONAA >60 12/16/2022    Lab Results  Component Value Date   WBC 6.3 12/16/2022   NEUTROABS 2.8 12/16/2022   HGB 11.5 (L) 12/16/2022   HCT 37.0 (L) 12/16/2022   MCV 70.1 (L) 12/16/2022   PLT 254 12/16/2022     STUDIES: No results found.  ASSESSMENT: Stage IVa hepatocellular carcinoma.  PLAN:    Stage IVa hepatocellular carcinoma: Diagnosis confirmed by liver biopsy on November 05, 2022.  PET scan results from November 01, 2022 reviewed independently and reported as above with 7.8 cm hypermetabolic liver mass as well as a hypermetabolic lymph node suspicious for metastasis. AFP is only mildly elevated at 38.9.  Case discussed at tumor board and while patient is likely not a transplant candidate he may be a candidate for direct  ablation if lesion can be decreased in size.  Patient has agreed to systemic treatment using Tecentriq and Avastin every 3 weeks.  Proceed with cycle 2 of treatment today.  Return to clinic in 3 weeks for further evaluation and consideration of cycle 3.  Consider reimaging in March 2025.   Anemia: Hemoglobin mildly improved to 11.5.  Monitor. Joint pain: Patient reports he uses ibuprofen sparingly.  Patient expressed understanding and was in agreement with this plan. He also understands that He can call clinic at any time with any questions, concerns, or complaints.    Cancer Staging  Hepatocellular carcinoma [facility]) Staging form: Liver, AJCC 8th Edition - Clinical stage from 11/18/2022: Stage IVA (cT1b, cN1, cM0) - Signed by Jeralyn Ruths, MD on 11/18/2022 Stage prefix: Initial diagnosis   Jeralyn Ruths, MD   12/16/2022 11:31 AM

## 2022-12-16 NOTE — Patient Instructions (Addendum)
[facility] - A DEPT OF [facility]  Discharge Instructions: Thank you for choosing [facility] to provide your oncology and hematology care.  If you have a lab appointment with the Cancer Center, please go directly to the Cancer Center and check in at the registration area.  Wear comfortable clothing and clothing appropriate for easy access to any Portacath or PICC line.   We strive to give you quality time with your provider. You may need to reschedule your appointment if you arrive late (15 or more minutes).  Arriving late affects you and other patients whose appointments are after yours.  Also, if you miss three or more appointments without notifying the office, you may be dismissed from the clinic at the provider's discretion.      For prescription refill requests, have your pharmacy contact our office and allow 72 hours for refills to be completed.    Today you received the following chemotherapy and/or immunotherapy agents MVASI and TECENTRIQ      To help prevent nausea and vomiting after your treatment, we encourage you to take your nausea medication as directed.  BELOW ARE SYMPTOMS THAT SHOULD BE REPORTED IMMEDIATELY: *FEVER GREATER THAN 100.4 F (38 C) OR HIGHER *CHILLS OR SWEATING *NAUSEA AND VOMITING THAT IS NOT CONTROLLED WITH YOUR NAUSEA MEDICATION *UNUSUAL SHORTNESS OF BREATH *UNUSUAL BRUISING OR BLEEDING *URINARY PROBLEMS (pain or burning when urinating, or frequent urination) *BOWEL PROBLEMS (unusual diarrhea, constipation, pain near the anus) TENDERNESS IN MOUTH AND THROAT WITH OR WITHOUT PRESENCE OF ULCERS (sore throat, sores in mouth, or a toothache) UNUSUAL RASH, SWELLING OR PAIN  UNUSUAL VAGINAL DISCHARGE OR ITCHING   Items with * indicate a potential emergency and should be followed up as soon as possible or go to the Emergency Department if any problems should occur.  Please show the CHEMOTHERAPY ALERT CARD or  IMMUNOTHERAPY ALERT CARD at check-in to the Emergency Department and triage nurse.  Should you have questions after your visit or need to cancel or reschedule your appointment, please contact [facility] - A DEPT OF [facility]  [phone] and follow the prompts.  Office hours are 8:00 a.m. to 4:30 p.m. Monday - Friday. Please note that voicemails left after 4:00 p.m. may not be returned until the following business day.  We are closed weekends and major holidays. You have access to a nurse at all times for urgent questions. Please call the main number to the clinic [phone] and follow the prompts.  For any non-urgent questions, you may also contact your provider using MyChart. We now offer e-Visits for anyone 74 and older to request care online for non-urgent symptoms. For details visit [email or web address].   Also download the MyChart app! Go to the app store, search "MyChart", open the app, select [facility], and log in with your MyChart username and password.

## 2022-12-17 ENCOUNTER — Ambulatory Visit: Payer: 59 | Admitting: Oncology

## 2022-12-17 ENCOUNTER — Ambulatory Visit: Payer: 59

## 2022-12-17 ENCOUNTER — Other Ambulatory Visit: Payer: 59

## 2022-12-27 ENCOUNTER — Other Ambulatory Visit: Payer: Self-pay | Admitting: Oncology

## 2022-12-28 ENCOUNTER — Other Ambulatory Visit: Payer: Self-pay

## 2023-01-06 ENCOUNTER — Inpatient Hospital Stay: Payer: 59

## 2023-01-06 ENCOUNTER — Other Ambulatory Visit: Payer: Self-pay | Admitting: Oncology

## 2023-01-06 ENCOUNTER — Inpatient Hospital Stay: Payer: 59 | Admitting: Oncology

## 2023-01-06 DIAGNOSIS — C220 Liver cell carcinoma: Secondary | ICD-10-CM

## 2023-01-07 ENCOUNTER — Ambulatory Visit: Payer: 59

## 2023-01-07 ENCOUNTER — Other Ambulatory Visit: Payer: Self-pay | Admitting: Oncology

## 2023-01-07 ENCOUNTER — Encounter: Payer: Self-pay | Admitting: Oncology

## 2023-01-07 ENCOUNTER — Other Ambulatory Visit: Payer: 59

## 2023-01-07 ENCOUNTER — Ambulatory Visit: Payer: 59 | Admitting: Oncology

## 2023-01-07 DIAGNOSIS — C220 Liver cell carcinoma: Secondary | ICD-10-CM

## 2023-01-09 ENCOUNTER — Other Ambulatory Visit: Payer: Self-pay

## 2023-01-11 ENCOUNTER — Encounter: Payer: Self-pay | Admitting: Oncology

## 2023-01-11 ENCOUNTER — Inpatient Hospital Stay: Payer: 59

## 2023-01-11 ENCOUNTER — Inpatient Hospital Stay (HOSPITAL_BASED_OUTPATIENT_CLINIC_OR_DEPARTMENT_OTHER): Payer: 59 | Admitting: Oncology

## 2023-01-11 VITALS — BP 128/81 | HR 61

## 2023-01-11 VITALS — BP 117/91 | HR 75 | Temp 97.5°F | Resp 16 | Ht 67.0 in | Wt 178.0 lb

## 2023-01-11 DIAGNOSIS — C220 Liver cell carcinoma: Secondary | ICD-10-CM

## 2023-01-11 DIAGNOSIS — Z5112 Encounter for antineoplastic immunotherapy: Secondary | ICD-10-CM | POA: Diagnosis not present

## 2023-01-11 LAB — CBC WITH DIFFERENTIAL (CANCER CENTER ONLY)
Abs Immature Granulocytes: 0.01 10*3/uL (ref 0.00–0.07)
Basophils Absolute: 0 10*3/uL (ref 0.0–0.1)
Basophils Relative: 0 %
Eosinophils Absolute: 0.5 10*3/uL (ref 0.0–0.5)
Eosinophils Relative: 7 %
HCT: 39.8 % (ref 39.0–52.0)
Hemoglobin: 12.6 g/dL — ABNORMAL LOW (ref 13.0–17.0)
Immature Granulocytes: 0 %
Lymphocytes Relative: 35 %
Lymphs Abs: 2.4 10*3/uL (ref 0.7–4.0)
MCH: 22.4 pg — ABNORMAL LOW (ref 26.0–34.0)
MCHC: 31.7 g/dL (ref 30.0–36.0)
MCV: 70.7 fL — ABNORMAL LOW (ref 80.0–100.0)
Monocytes Absolute: 0.6 10*3/uL (ref 0.1–1.0)
Monocytes Relative: 8 %
Neutro Abs: 3.5 10*3/uL (ref 1.7–7.7)
Neutrophils Relative %: 50 %
Platelet Count: 274 10*3/uL (ref 150–400)
RBC: 5.63 MIL/uL (ref 4.22–5.81)
RDW: 15.6 % — ABNORMAL HIGH (ref 11.5–15.5)
WBC Count: 6.9 10*3/uL (ref 4.0–10.5)
nRBC: 0 % (ref 0.0–0.2)

## 2023-01-11 LAB — CMP (CANCER CENTER ONLY)
ALT: 15 U/L (ref 0–44)
AST: 19 U/L (ref 15–41)
Albumin: 4.2 g/dL (ref 3.5–5.0)
Alkaline Phosphatase: 48 U/L (ref 38–126)
Anion gap: 11 (ref 5–15)
BUN: 19 mg/dL (ref 8–23)
CO2: 24 mmol/L (ref 22–32)
Calcium: 9.2 mg/dL (ref 8.9–10.3)
Chloride: 100 mmol/L (ref 98–111)
Creatinine: 1.25 mg/dL — ABNORMAL HIGH (ref 0.61–1.24)
GFR, Estimated: >60 mL/min (ref >60)
Glucose, Bld: 179 mg/dL — ABNORMAL HIGH (ref 70–99)
Potassium: 4.4 mmol/L (ref 3.5–5.1)
Sodium: 135 mmol/L (ref 135–145)
Total Bilirubin: 0.7 mg/dL (ref 0.0–1.2)
Total Protein: 7.9 g/dL (ref 6.5–8.1)

## 2023-01-11 LAB — TOTAL PROTEIN, URINE DIPSTICK: Protein, ur: NEGATIVE mg/dL

## 2023-01-11 LAB — TSH: TSH: 3.091 u[IU]/mL (ref 0.350–4.500)

## 2023-01-11 MED ORDER — SODIUM CHLORIDE 0.9 % IV SOLN
15.0000 mg/kg | Freq: Once | INTRAVENOUS | Status: AC
  Administered 2023-01-11: 1200 mg via INTRAVENOUS
  Filled 2023-01-11: qty 48

## 2023-01-11 MED ORDER — SODIUM CHLORIDE 0.9 % IV SOLN
1200.0000 mg | Freq: Once | INTRAVENOUS | Status: AC
  Administered 2023-01-11: 1200 mg via INTRAVENOUS
  Filled 2023-01-11: qty 20

## 2023-01-11 MED ORDER — SODIUM CHLORIDE 0.9 % IV SOLN
INTRAVENOUS | Status: DC
  Filled 2023-01-11 (×2): qty 250

## 2023-01-11 NOTE — Progress Notes (Signed)
Wants to know if he could get referred to PT due to weakness and pain in shoulders, arms and back. Wants to know when he is due for more imaging. Started noticing numbness and tingling in left foot 2 weeks ago. Very fatigue and no energy.

## 2023-01-11 NOTE — Patient Instructions (Signed)
 [facility] - A DEPT OF [facility]  Discharge Instructions: Thank you for choosing [facility] to provide your oncology and hematology care.  If you have a lab appointment with the Cancer Center, please go directly to the Cancer Center and check in at the registration area.  Wear comfortable clothing and clothing appropriate for easy access to any Portacath or PICC line.   We strive to give you quality time with your provider. You may need to reschedule your appointment if you arrive late (15 or more minutes).  Arriving late affects you and other patients whose appointments are after yours.  Also, if you miss three or more appointments without notifying the office, you may be dismissed from the clinic at the provider's discretion.      For prescription refill requests, have your pharmacy contact our office and allow 72 hours for refills to be completed.    Today you received the following chemotherapy and/or immunotherapy agents MVASI and TECENTRIQ      To help prevent nausea and vomiting after your treatment, we encourage you to take your nausea medication as directed.  BELOW ARE SYMPTOMS THAT SHOULD BE REPORTED IMMEDIATELY: *FEVER GREATER THAN 100.4 F (38 C) OR HIGHER *CHILLS OR SWEATING *NAUSEA AND VOMITING THAT IS NOT CONTROLLED WITH YOUR NAUSEA MEDICATION *UNUSUAL SHORTNESS OF BREATH *UNUSUAL BRUISING OR BLEEDING *URINARY PROBLEMS (pain or burning when urinating, or frequent urination) *BOWEL PROBLEMS (unusual diarrhea, constipation, pain near the anus) TENDERNESS IN MOUTH AND THROAT WITH OR WITHOUT PRESENCE OF ULCERS (sore throat, sores in mouth, or a toothache) UNUSUAL RASH, SWELLING OR PAIN  UNUSUAL VAGINAL DISCHARGE OR ITCHING   Items with * indicate a potential emergency and should be followed up as soon as possible or go to the Emergency Department if any problems should occur.  Please show the CHEMOTHERAPY ALERT CARD or  IMMUNOTHERAPY ALERT CARD at check-in to the Emergency Department and triage nurse.  Should you have questions after your visit or need to cancel or reschedule your appointment, please contact [facility] - A DEPT OF [facility]  [phone] and follow the prompts.  Office hours are 8:00 a.m. to 4:30 p.m. Monday - Friday. Please note that voicemails left after 4:00 p.m. may not be returned until the following business day.  We are closed weekends and major holidays. You have access to a nurse at all times for urgent questions. Please call the main number to the clinic [phone] and follow the prompts.  For any non-urgent questions, you may also contact your provider using MyChart. We now offer e-Visits for anyone 74 and older to request care online for non-urgent symptoms. For details visit [email or web address].   Also download the MyChart app! Go to the app store, search "MyChart", open the app, select [facility], and log in with your MyChart username and password.

## 2023-01-11 NOTE — Progress Notes (Signed)
 [facility]  Telephone:([phone] Fax:(336) [phone]  ID: Aaron Lowery OB: 21-Apr-1950  MR#: [id number]  RDW#:[id number]  Patient Care Team: [facility] as [facility]CP - General Maurie Rayfield BIRCH, RN as Oncology Nurse Navigator Jacobo, Evalene PARAS, MD as Consulting Physician (Oncology)  CHIEF COMPLAINT: Stage IVa hepatocellular carcinoma.  INTERVAL HISTORY: Patient returns to clinic today for further evaluation and consideration of cycle 3 of Tecentriq  and Avastin .  He has increased fatigue, but admits he does not sleep well.  He otherwise feels well.  He is tolerating his treatments without significant side effects. He has no neurologic complaints.  He denies any recent fevers or illnesses.  He has a good appetite and denies any further weight loss.  He has no chest pain, shortness of breath, cough, or hemoptysis.  He denies any nausea, vomiting, constipation, or diarrhea.  He has no melena or hematochezia.  He denies any urinary complaints.  Patient offers no further specific complaints today.  REVIEW OF SYSTEMS:   Review of Systems  Constitutional:  Positive for malaise/fatigue. Negative for fever and weight loss.  Respiratory: Negative.  Negative for cough, hemoptysis and shortness of breath.   Cardiovascular: Negative.  Negative for chest pain and leg swelling.  Gastrointestinal: Negative.  Negative for abdominal pain, blood in stool and melena.  Genitourinary: Negative.  Negative for dysuria.  Musculoskeletal: Negative.  Negative for back pain and joint pain.  Skin: Negative.  Negative for rash.  Neurological: Negative.  Negative for dizziness, focal weakness, weakness and headaches.  Psychiatric/Behavioral:  The patient has insomnia. The patient is not nervous/anxious.     As per HPI. Otherwise, a complete review of systems is negative.  PAST MEDICAL HISTORY: Past Medical History:  Diagnosis Date   Hypercholesteremia    Hypertension     PAST  SURGICAL HISTORY: History reviewed. No pertinent surgical history.  FAMILY HISTORY: History reviewed. No pertinent family history.  ADVANCED DIRECTIVES (Y/N):  N  HEALTH MAINTENANCE: Social History   Tobacco Use   Smoking status: Former    Types: Cigarettes   Smokeless tobacco: Never  Vaping Use   Vaping status: Never Used  Substance Use Topics   Alcohol use: Yes    Alcohol/week: 3.0 standard drinks of alcohol    Types: 3 Cans of beer per week   Drug use: Never     Colonoscopy:  PAP:  Bone density:  Lipid panel:  No Known Allergies  Current Outpatient Medications  Medication Sig Dispense Refill   ibuprofen (ADVIL) 800 MG tablet Take 800 mg by mouth every 6 (six) hours as needed.     levocetirizine (XYZAL) 5 MG tablet Take 5 mg by mouth every evening.     losartan (COZAAR) 50 MG tablet Take 50 mg by mouth 2 (two) times daily.     pravastatin (PRAVACHOL) 40 MG tablet Take 40 mg by mouth at bedtime.     prednisoLONE  acetate (PRED FORTE ) 1 % ophthalmic suspension Place 1 drop into the right eye every 4 (four) hours.     prochlorperazine  (COMPAZINE ) 10 MG tablet Take 1 tablet (10 mg total) by mouth every 6 (six) hours as needed for nausea or vomiting. 60 tablet 1   Travoprost, BAK Free, (TRAVATAN) 0.004 % SOLN ophthalmic solution Place 1 drop into both eyes at bedtime.     traZODone  (DESYREL ) 100 MG tablet Take 100 mg by mouth at bedtime.     trimethoprim -polymyxin b  (POLYTRIM ) ophthalmic solution Place 1 drop into the right  eye every 4 (four) hours.     No current facility-administered medications for this visit.    OBJECTIVE: Vitals:   01/11/23 0836  BP: (!) 117/91  Pulse: 75  Resp: 16  Temp: (!) 97.5 F (36.4 C)  SpO2: 99%     Body mass index is 27.88 kg/m.    ECOG FS:0 - Asymptomatic  General: Well-developed, well-nourished, no acute distress. Eyes: Pink conjunctiva, anicteric sclera. HEENT: Normocephalic, moist mucous membranes. Lungs: No audible wheezing  or coughing. Heart: Regular rate and rhythm. Abdomen: Soft, nontender, no obvious distention. Musculoskeletal: No edema, cyanosis, or clubbing. Neuro: Alert, answering all questions appropriately. Cranial nerves grossly intact. Skin: No rashes or petechiae noted. Psych: Normal affect.  LAB RESULTS:  Lab Results  Component Value Date   NA 135 01/11/2023   K 4.4 01/11/2023   CL 100 01/11/2023   CO2 24 01/11/2023   GLUCOSE 179 (H) 01/11/2023   BUN 19 01/11/2023   CREATININE 1.25 (H) 01/11/2023   CALCIUM  9.2 01/11/2023   PROT 7.9 01/11/2023   ALBUMIN 4.2 01/11/2023   AST 19 01/11/2023   ALT 15 01/11/2023   ALKPHOS 48 01/11/2023   BILITOT 0.7 01/11/2023   GFRNONAA >60 01/11/2023    Lab Results  Component Value Date   WBC 6.9 01/11/2023   NEUTROABS 3.5 01/11/2023   HGB 12.6 (L) 01/11/2023   HCT 39.8 01/11/2023   MCV 70.7 (L) 01/11/2023   PLT 274 01/11/2023     STUDIES: No results found.  ASSESSMENT: Stage IVa hepatocellular carcinoma.  PLAN:    Stage IVa hepatocellular carcinoma: Diagnosis confirmed by liver biopsy on November 05, 2022.  PET scan results from November 01, 2022 reviewed independently and reported as above with 7.8 cm hypermetabolic liver mass as well as a hypermetabolic lymph node suspicious for metastasis. AFP is only mildly elevated at 38.9.  Case discussed at tumor board and while patient is likely not a transplant candidate he may be a candidate for direct ablation if lesion can be decreased in size.  Patient has agreed to systemic treatment using Tecentriq  and Avastin  every 3 weeks.  Proceed with cycle 3 of treatment today.  Return to clinic in 3 weeks for further evaluation and consideration of cycle 4.  Will reimage in March 2025.   Anemia: Essentially resolved.   Joint pain: Patient does not complain of this today.  Patient reports he uses ibuprofen sparingly. Fatigue/insomnia: Patient was given referrals to both the [facility]ARE program and [facility].  Patient expressed understanding and was in agreement with this plan. He also understands that He can call clinic at any time with any questions, concerns, or complaints.    Cancer Staging  Hepatocellular carcinoma [facility]) Staging form: Liver, AJCC 8th Edition - Clinical stage from 11/18/2022: Stage IVA (cT1b, cN1, cM0) - Signed by Jacobo Evalene PARAS, MD on 11/18/2022 Stage prefix: Initial diagnosis   Evalene PARAS Jacobo, MD   01/11/2023 9:46 AM

## 2023-01-12 LAB — T4: T4, Total: 5.6 ug/dL (ref 4.5–12.0)

## 2023-01-19 NOTE — Progress Notes (Signed)
 Patient consented and enrolled in [facility] services on 01/19/23. Patient scheduled for initial behavioral health evaluation on 01/24/23. Referral to [facility] closed.

## 2023-01-21 ENCOUNTER — Telehealth: Payer: Self-pay | Admitting: *Deleted

## 2023-01-21 NOTE — Telephone Encounter (Signed)
 Patient called reporting that he feels depressed and fatigued and is asking what he can do about it. Please advise

## 2023-01-24 ENCOUNTER — Encounter: Payer: Self-pay | Admitting: Oncology

## 2023-01-27 ENCOUNTER — Ambulatory Visit: Payer: 59 | Admitting: Oncology

## 2023-01-27 ENCOUNTER — Ambulatory Visit: Payer: 59

## 2023-01-27 ENCOUNTER — Encounter: Payer: Self-pay | Admitting: Oncology

## 2023-01-27 ENCOUNTER — Other Ambulatory Visit: Payer: Self-pay | Admitting: Oncology

## 2023-01-27 ENCOUNTER — Other Ambulatory Visit: Payer: 59

## 2023-01-27 DIAGNOSIS — F4323 Adjustment disorder with mixed anxiety and depressed mood: Secondary | ICD-10-CM | POA: Diagnosis not present

## 2023-01-27 DIAGNOSIS — C220 Liver cell carcinoma: Secondary | ICD-10-CM | POA: Diagnosis not present

## 2023-01-27 MED ORDER — MIRTAZAPINE 15 MG PO TABS: 15.0000 mg | ORAL_TABLET | Freq: Every day | ORAL | 2 refills | Status: DC

## 2023-01-27 NOTE — Progress Notes (Signed)
[facility] - Medication Recommendation   Dear Referring Specialist:  Thank you for referring your patient to [facility] Below you will find my new psychiatric medication recommendations.  Once reviewed, please confirm prescription(s) within 2 business days. If you modify or decline the recommendations, please document these modifications and notify us. If you have any questions or concerns, please call our [facility] provider line at [phone].  Best,   Dr. Kathlynn Grate, MD  --  Name: Aaron Lowery Date of Birth: [birth year] Referring Provider: Gerarda Fraction, MD Patient MRN: [id number]  Most recent Assessments: Depression (PHQ-9) 01/26/2023 9 Anxiety (GAD-7) 01/26/2023 6  Medication Recommendations:  Psychiatric Provider recommends that the Referring Specialist prescribe the below medications....  1. Medication (name, dose, instructions) Start Mirtazapine 15mg  nightly Reason for Recommendation: To address insomnia, mood and appetite  --  Marissa Nestle  [facility] Manager

## 2023-02-03 ENCOUNTER — Inpatient Hospital Stay: Payer: Medicare Other | Attending: Oncology

## 2023-02-03 ENCOUNTER — Inpatient Hospital Stay (HOSPITAL_BASED_OUTPATIENT_CLINIC_OR_DEPARTMENT_OTHER): Payer: Medicare Other | Admitting: Oncology

## 2023-02-03 ENCOUNTER — Encounter: Payer: Self-pay | Admitting: Oncology

## 2023-02-03 ENCOUNTER — Inpatient Hospital Stay: Payer: Medicare Other

## 2023-02-03 VITALS — BP 150/97 | HR 88 | Temp 98.5°F | Resp 18 | Wt 184.6 lb

## 2023-02-03 DIAGNOSIS — Z5112 Encounter for antineoplastic immunotherapy: Secondary | ICD-10-CM | POA: Diagnosis present

## 2023-02-03 DIAGNOSIS — C220 Liver cell carcinoma: Secondary | ICD-10-CM

## 2023-02-03 DIAGNOSIS — Z79899 Other long term (current) drug therapy: Secondary | ICD-10-CM | POA: Diagnosis not present

## 2023-02-03 DIAGNOSIS — E871 Hypo-osmolality and hyponatremia: Secondary | ICD-10-CM | POA: Insufficient documentation

## 2023-02-03 DIAGNOSIS — D649 Anemia, unspecified: Secondary | ICD-10-CM | POA: Diagnosis present

## 2023-02-03 DIAGNOSIS — Z87891 Personal history of nicotine dependence: Secondary | ICD-10-CM | POA: Diagnosis not present

## 2023-02-03 LAB — CMP (CANCER CENTER ONLY)
ALT: 14 U/L (ref 0–44)
AST: 19 U/L (ref 15–41)
Albumin: 4.2 g/dL (ref 3.5–5.0)
Alkaline Phosphatase: 46 U/L (ref 38–126)
Anion gap: 9 (ref 5–15)
BUN: 17 mg/dL (ref 8–23)
CO2: 22 mmol/L (ref 22–32)
Calcium: 9.1 mg/dL (ref 8.9–10.3)
Chloride: 102 mmol/L (ref 98–111)
Creatinine: 1.15 mg/dL (ref 0.61–1.24)
GFR, Estimated: >60 mL/min (ref >60)
Glucose, Bld: 158 mg/dL — ABNORMAL HIGH (ref 70–99)
Potassium: 4.1 mmol/L (ref 3.5–5.1)
Sodium: 133 mmol/L — ABNORMAL LOW (ref 135–145)
Total Bilirubin: 0.5 mg/dL (ref 0.0–1.2)
Total Protein: 7.5 g/dL (ref 6.5–8.1)

## 2023-02-03 LAB — CBC WITH DIFFERENTIAL (CANCER CENTER ONLY)
Abs Immature Granulocytes: 0.04 10*3/uL (ref 0.00–0.07)
Basophils Absolute: 0 10*3/uL (ref 0.0–0.1)
Basophils Relative: 0 %
Eosinophils Absolute: 0.5 10*3/uL (ref 0.0–0.5)
Eosinophils Relative: 6 %
HCT: 40.9 % (ref 39.0–52.0)
Hemoglobin: 13.3 g/dL (ref 13.0–17.0)
Immature Granulocytes: 1 %
Lymphocytes Relative: 35 %
Lymphs Abs: 3.1 10*3/uL (ref 0.7–4.0)
MCH: 22.7 pg — ABNORMAL LOW (ref 26.0–34.0)
MCHC: 32.5 g/dL (ref 30.0–36.0)
MCV: 69.7 fL — ABNORMAL LOW (ref 80.0–100.0)
Monocytes Absolute: 0.7 10*3/uL (ref 0.1–1.0)
Monocytes Relative: 8 %
Neutro Abs: 4.4 10*3/uL (ref 1.7–7.7)
Neutrophils Relative %: 50 %
Platelet Count: 347 10*3/uL (ref 150–400)
RBC: 5.87 MIL/uL — ABNORMAL HIGH (ref 4.22–5.81)
RDW: 15.1 % (ref 11.5–15.5)
Smear Review: NORMAL
WBC Count: 8.8 10*3/uL (ref 4.0–10.5)
nRBC: 0 % (ref 0.0–0.2)

## 2023-02-03 MED ORDER — SODIUM CHLORIDE 0.9 % IV SOLN
15.0000 mg/kg | Freq: Once | INTRAVENOUS | Status: AC
  Administered 2023-02-03: 1200 mg via INTRAVENOUS
  Filled 2023-02-03: qty 48

## 2023-02-03 MED ORDER — SODIUM CHLORIDE 0.9 % IV SOLN
1200.0000 mg | Freq: Once | INTRAVENOUS | Status: AC
  Administered 2023-02-03: 1200 mg via INTRAVENOUS
  Filled 2023-02-03: qty 20

## 2023-02-03 MED ORDER — SODIUM CHLORIDE 0.9 % IV SOLN
INTRAVENOUS | Status: DC
  Filled 2023-02-03: qty 250

## 2023-02-03 NOTE — Progress Notes (Signed)
[facility]  Telephone:(336) [phone] Fax:(336) [phone]  ID: Aaron Lowery OB: 12-Aug-1950  MR#: [id number]  AOZ#:[id number]  Patient Care Team: [facility] as PCP - General Benita Gutter, RN as Oncology Nurse Navigator Orlie Dakin, Tollie Pizza, MD as Consulting Physician (Oncology)  CHIEF COMPLAINT: Stage IVa hepatocellular carcinoma.  INTERVAL HISTORY: Patient returns to clinic today for further evaluation and consideration of cycle 4 of Tecentriq and Avastin.  He continues to have fatigue and insomnia, but otherwise feels well and is tolerating his treatments without significant side effects.  He has no neurologic complaints.  He denies any recent fevers or illnesses.  He has a good appetite and denies any further weight loss.  He has no chest pain, shortness of breath, cough, or hemoptysis.  He denies any nausea, vomiting, constipation, or diarrhea.  He has no melena or hematochezia.  He denies any urinary complaints.  Patient offers no further specific complaints today.  REVIEW OF SYSTEMS:   Review of Systems  Constitutional:  Positive for malaise/fatigue. Negative for fever and weight loss.  Respiratory: Negative.  Negative for cough, hemoptysis and shortness of breath.   Cardiovascular: Negative.  Negative for chest pain and leg swelling.  Gastrointestinal: Negative.  Negative for abdominal pain, blood in stool and melena.  Genitourinary: Negative.  Negative for dysuria.  Musculoskeletal: Negative.  Negative for back pain and joint pain.  Skin: Negative.  Negative for rash.  Neurological: Negative.  Negative for dizziness, focal weakness, weakness and headaches.  Psychiatric/Behavioral:  The patient has insomnia. The patient is not nervous/anxious.     As per HPI. Otherwise, a complete review of systems is negative.  PAST MEDICAL HISTORY: Past Medical History:  Diagnosis Date   Hypercholesteremia    Hypertension     PAST SURGICAL  HISTORY: History reviewed. No pertinent surgical history.  FAMILY HISTORY: History reviewed. No pertinent family history.  ADVANCED DIRECTIVES (Y/N):  N  HEALTH MAINTENANCE: Social History   Tobacco Use   Smoking status: Former    Types: Cigarettes   Smokeless tobacco: Never  Vaping Use   Vaping status: Never Used  Substance Use Topics   Alcohol use: Yes    Alcohol/week: 3.0 standard drinks of alcohol    Types: 3 Cans of beer per week   Drug use: Never     Colonoscopy:  PAP:  Bone density:  Lipid panel:  No Known Allergies  Current Outpatient Medications  Medication Sig Dispense Refill   ibuprofen (ADVIL) 800 MG tablet Take 800 mg by mouth every 6 (six) hours as needed.     levocetirizine (XYZAL) 5 MG tablet Take 5 mg by mouth every evening.     losartan (COZAAR) 50 MG tablet Take 50 mg by mouth 2 (two) times daily.     mirtazapine (REMERON) 15 MG tablet Take 1 tablet (15 mg total) by mouth at bedtime. 30 tablet 2   pravastatin (PRAVACHOL) 40 MG tablet Take 40 mg by mouth at bedtime.     prednisoLONE acetate (PRED FORTE) 1 % ophthalmic suspension Place 1 drop into the right eye every 4 (four) hours.     prochlorperazine (COMPAZINE) 10 MG tablet Take 1 tablet (10 mg total) by mouth every 6 (six) hours as needed for nausea or vomiting. 60 tablet 1   Travoprost, BAK Free, (TRAVATAN) 0.004 % SOLN ophthalmic solution Place 1 drop into both eyes at bedtime.     traZODone (DESYREL) 100 MG tablet Take 100 mg by mouth at bedtime.  trimethoprim-polymyxin b (POLYTRIM) ophthalmic solution Place 1 drop into the right eye every 4 (four) hours.     No current facility-administered medications for this visit.    OBJECTIVE: Vitals:   02/03/23 0839  BP: (!) 150/97  Pulse: 88  Resp: 18  Temp: 98.5 F (36.9 C)  SpO2: 100%     Body mass index is 28.91 kg/m.    ECOG FS:0 - Asymptomatic  General: Well-developed, well-nourished, no acute distress. Eyes: Pink conjunctiva,  anicteric sclera. HEENT: Normocephalic, moist mucous membranes. Lungs: No audible wheezing or coughing. Heart: Regular rate and rhythm. Abdomen: Soft, nontender, no obvious distention. Musculoskeletal: No edema, cyanosis, or clubbing. Neuro: Alert, answering all questions appropriately. Cranial nerves grossly intact. Skin: No rashes or petechiae noted. Psych: Normal affect.  LAB RESULTS:  Lab Results  Component Value Date   NA 133 (L) 02/03/2023   K 4.1 02/03/2023   CL 102 02/03/2023   CO2 22 02/03/2023   GLUCOSE 158 (H) 02/03/2023   BUN 17 02/03/2023   CREATININE 1.15 02/03/2023   CALCIUM 9.1 02/03/2023   PROT 7.5 02/03/2023   ALBUMIN 4.2 02/03/2023   AST 19 02/03/2023   ALT 14 02/03/2023   ALKPHOS 46 02/03/2023   BILITOT 0.5 02/03/2023   GFRNONAA >60 02/03/2023    Lab Results  Component Value Date   WBC 8.8 02/03/2023   NEUTROABS PENDING 02/03/2023   HGB 13.3 02/03/2023   HCT 40.9 02/03/2023   MCV 69.7 (L) 02/03/2023   PLT 347 02/03/2023     STUDIES: No results found.  ASSESSMENT: Stage IVa hepatocellular carcinoma.  PLAN:    Stage IVa hepatocellular carcinoma: Diagnosis confirmed by liver biopsy on November 05, 2022.  PET scan results from November 01, 2022 reviewed independently and reported as above with 7.8 cm hypermetabolic liver mass as well as a hypermetabolic lymph node suspicious for metastasis. AFP is only mildly elevated at 38.9.  Case discussed at tumor board and while patient is likely not a transplant candidate he may be a candidate for direct ablation if lesion can be decreased in size.  Patient has agreed to systemic treatment using Tecentriq and Avastin every 3 weeks.  Proceed with cycle 4 of treatment today.  Return to clinic in 3 weeks for further evaluation and consideration of cycle 5.  Will reimage in March 2025 prior to cycle 7.   Anemia: Resolved.   Joint pain: Patient does not complain of this today.  Patient reports he uses ibuprofen  sparingly. Fatigue/insomnia: Patient was given referrals to both the [facility] program and [facility].  He also recently started Remeron 15 mg at night. Hyponatremia: Mild, monitor.  Patient expressed understanding and was in agreement with this plan. He also understands that He can call clinic at any time with any questions, concerns, or complaints.    Cancer Staging  Hepatocellular carcinoma [facility]) Staging form: Liver, AJCC 8th Edition - Clinical stage from 11/18/2022: Stage IVA (cT1b, cN1, cM0) - Signed by Jeralyn Ruths, MD on 11/18/2022 Stage prefix: Initial diagnosis   Jeralyn Ruths, MD   02/03/2023 8:49 AM

## 2023-02-03 NOTE — Progress Notes (Signed)
Patient states he would like to see photos of his liver because he would like to see exactly where the cancer is & how it looks.

## 2023-02-03 NOTE — Patient Instructions (Signed)
[facility] - A DEPT OF [facility]  Discharge Instructions: Thank you for choosing [facility] to provide your oncology and hematology care.  If you have a lab appointment with the Cancer Center, please go directly to the Cancer Center and check in at the registration area.  Wear comfortable clothing and clothing appropriate for easy access to any Portacath or PICC line.   We strive to give you quality time with your provider. You may need to reschedule your appointment if you arrive late (15 or more minutes).  Arriving late affects you and other patients whose appointments are after yours.  Also, if you miss three or more appointments without notifying the office, you may be dismissed from the clinic at the provider's discretion.      For prescription refill requests, have your pharmacy contact our office and allow 72 hours for refills to be completed.    Today you received the following chemotherapy and/or immunotherapy agents TECENTRIQ and MVASI      To help prevent nausea and vomiting after your treatment, we encourage you to take your nausea medication as directed.  BELOW ARE SYMPTOMS THAT SHOULD BE REPORTED IMMEDIATELY: *FEVER GREATER THAN 100.4 F (38 C) OR HIGHER *CHILLS OR SWEATING *NAUSEA AND VOMITING THAT IS NOT CONTROLLED WITH YOUR NAUSEA MEDICATION *UNUSUAL SHORTNESS OF BREATH *UNUSUAL BRUISING OR BLEEDING *URINARY PROBLEMS (pain or burning when urinating, or frequent urination) *BOWEL PROBLEMS (unusual diarrhea, constipation, pain near the anus) TENDERNESS IN MOUTH AND THROAT WITH OR WITHOUT PRESENCE OF ULCERS (sore throat, sores in mouth, or a toothache) UNUSUAL RASH, SWELLING OR PAIN  UNUSUAL VAGINAL DISCHARGE OR ITCHING   Items with * indicate a potential emergency and should be followed up as soon as possible or go to the Emergency Department if any problems should occur.  Please show the CHEMOTHERAPY ALERT CARD or  IMMUNOTHERAPY ALERT CARD at check-in to the Emergency Department and triage nurse.  Should you have questions after your visit or need to cancel or reschedule your appointment, please contact [facility] - A DEPT OF [facility]  [phone] and follow the prompts.  Office hours are 8:00 a.m. to 4:30 p.m. Monday - Friday. Please note that voicemails left after 4:00 p.m. may not be returned until the following business day.  We are closed weekends and major holidays. You have access to a nurse at all times for urgent questions. Please call the main number to the clinic [phone] and follow the prompts.  For any non-urgent questions, you may also contact your provider using MyChart. We now offer e-Visits for anyone 43 and older to request care online for non-urgent symptoms. For details visit [email or web address].   Atezolizumab Injection What is this medication? ATEZOLIZUMAB (a te zoe LIZ ue mab) treats some types of cancer. It works by helping your immune system slow or stop the spread of cancer cells. It is a monoclonal antibody. This medicine may be used for other purposes; ask your health care provider or pharmacist if you have questions. COMMON BRAND NAME(S): Tecentriq What should I tell my care team before I take this medication? They need to know if you have any of these conditions: Allogeneic stem cell transplant (uses someone else's stem cells) Autoimmune diseases, such as Crohn disease, ulcerative colitis, lupus History of chest radiation Nervous system problems, such as Guillain-Barre syndrome, myasthenia gravis Organ transplant An unusual or allergic reaction to [employer],  other medications, foods, dyes, or preservatives Pregnant or trying to get pregnant Breast-feeding How should I use this medication? This medication is injected into a vein. It is given by your care team in a hospital or clinic setting. A special MedGuide will be given to  you before each treatment. Be sure to read this information carefully each time. Talk to your care team about the use of this medication in children. While it may be prescribed for children as young as 2 years for selected conditions, precautions do apply. Overdosage: If you think you have taken too much of this medicine contact a poison control center or emergency room at once. NOTE: This medicine is only for you. Do not share this medicine with others. What if I miss a dose? Keep appointments for follow-up doses. It is important not to miss your dose. Call your care team if you are unable to keep an appointment. What may interact with this medication? Interactions have not been studied. This list may not describe all possible interactions. Give your health care provider a list of all the medicines, herbs, non-prescription drugs, or dietary supplements you use. Also tell them if you smoke, drink alcohol, or use illegal drugs. Some items may interact with your medicine. What should I watch for while using this medication? Your condition will be monitored carefully while you are receiving this medication. You may need blood work done while you are taking this medication. This medication may cause serious skin reactions. They can happen weeks to months after starting the medication. Contact your care team right away if you notice fevers or flu-like symptoms with a rash. The rash may be red or purple and then turn into blisters or peeling of the skin. You may also notice a red rash with swelling of the face, lips, or lymph nodes in your neck or under your arms. Talk to your care team if you may be pregnant. Serious birth defects can occur if you take this medication during pregnancy and for 5 months after the last dose. You will need a negative pregnancy test before starting this medication. Contraception is recommended while taking this medication and for 5 months after the last dose. Your care team can  help you find the option that works for you. Do not breastfeed while taking this medication and for 5 months after the last dose. This medication may cause infertility. Talk to your care team if you are concerned about your fertility. What side effects may I notice from receiving this medication? Side effects that you should report to your doctor or health care professional as soon as possible: Allergic reactions--skin rash, itching, hives, swelling of the face, lips, tongue, or throat Dry cough, shortness of breath or trouble breathing Eye pain, redness, irritation, or discharge with blurry or decreased vision Heart muscle inflammation--unusual weakness or fatigue, shortness of breath, chest pain, fast or irregular heartbeat, dizziness, swelling of the ankles, feet, or hands Hormone gland problems--headache, sensitivity to light, unusual weakness or fatigue, dizziness, fast or irregular heartbeat, increased sensitivity to cold or heat, excessive sweating, constipation, hair loss, increased thirst or amount of urine, tremors or shaking, irritability Infusion reactions--chest pain, shortness of breath or trouble breathing, feeling faint or lightheaded Kidney injury (glomerulonephritis)--decrease in the amount of urine, red or dark brown urine, foamy or bubbly urine, swelling of the ankles, hands, or feet Liver injury--right upper belly pain, loss of appetite, nausea, light-colored stool, dark yellow or brown urine, yellowing skin or eyes, unusual weakness  or fatigue Pain, tingling, or numbness in the hands or feet, muscle weakness, change in vision, confusion or trouble speaking, loss of balance or coordination, trouble walking, seizures Rash, fever, and swollen lymph nodes Redness, blistering, peeling, or loosening of the skin, including inside the mouth Sudden or severe stomach pain, bloody diarrhea, fever, nausea, vomiting Side effects that usually do not require medical attention (report to your  doctor or health care professional if they continue or are bothersome): Bone, joint, or muscle pain Diarrhea Fatigue Loss of appetite Nausea Skin rash This list may not describe all possible side effects. Call your doctor for medical advice about side effects. You may report side effects to FDA at 1-800-FDA-1088. Where should I keep my medication? This medication is given in a hospital or clinic. It will not be stored at home. NOTE: This sheet is a summary. It may not cover all possible information. If you have questions about this medicine, talk to your doctor, pharmacist, or health care provider.  2024 Elsevier/Gold Standard (2022-10-13 00:00:00) Also download the MyChart app! Go to the app store, search "MyChart", open the app, select [place], and log in with your MyChart username and password.  Bevacizumab Injection What is this medication? BEVACIZUMAB (be va SIZ yoo mab) treats some types of cancer. It works by blocking a protein that causes cancer cells to grow and multiply. This helps to slow or stop the spread of cancer cells. It is a monoclonal antibody. This medicine may be used for other purposes; ask your health care provider or pharmacist if you have questions. COMMON BRAND NAME(S): Alymsys, Avastin, MVASI, [place] What should I tell my care team before I take this medication? They need to know if you have any of these conditions: Blood clots Coughing up blood Having or recent surgery Heart failure High blood pressure History of a connection between 2 or more body parts that do not usually connect (fistula) History of a tear in your stomach or intestines Protein in your urine An unusual or allergic reaction to bevacizumab, other medications, foods, dyes, or preservatives Pregnant or trying to get pregnant Breast-feeding How should I use this medication? This medication is injected into a vein. It is given by your care team in a hospital or clinic  setting. Talk to your care team the use of this medication in children. Special care may be needed. Overdosage: If you think you have taken too much of this medicine contact a poison control center or emergency room at once. NOTE: This medicine is only for you. Do not share this medicine with others. What if I miss a dose? Keep appointments for follow-up doses. It is important not to miss your dose. Call your care team if you are unable to keep an appointment. What may interact with this medication? Interactions are not expected. This list may not describe all possible interactions. Give your health care provider a list of all the medicines, herbs, non-prescription drugs, or dietary supplements you use. Also tell them if you smoke, drink alcohol, or use illegal drugs. Some items may interact with your medicine. What should I watch for while using this medication? Your condition will be monitored carefully while you are receiving this medication. You may need blood work while taking this medication. This medication may make you feel generally unwell. This is not uncommon as chemotherapy can affect healthy cells as well as cancer cells. Report any side effects. Continue your course of treatment even though you feel ill unless  your care team tells you to stop. This medication may increase your risk to bruise or bleed. Call your care team if you notice any unusual bleeding. Before having surgery, talk to your care team to make sure it is ok. This medication can increase the risk of poor healing of your surgical site or wound. You will need to stop this medication for 28 days before surgery. After surgery, wait at least 28 days before restarting this medication. Make sure the surgical site or wound is healed enough before restarting this medication. Talk to your care team if questions. Talk to your care team if you may be pregnant. Serious birth defects can occur if you take this medication during pregnancy  and for 6 months after the last dose. Contraception is recommended while taking this medication and for 6 months after the last dose. Your care team can help you find the option that works for you. Do not breastfeed while taking this medication and for 6 months after the last dose. This medication can cause infertility. Talk to your care team if you are concerned about your fertility. What side effects may I notice from receiving this medication? Side effects that you should report to your care team as soon as possible: Allergic reactions--skin rash, itching, hives, swelling of the face, lips, tongue, or throat Bleeding--bloody or black, tar-like stools, vomiting blood or brown material that looks like coffee grounds, red or dark brown urine, small red or purple spots on skin, unusual bruising or bleeding Blood clot--pain, swelling, or warmth in the leg, shortness of breath, chest pain Heart attack--pain or tightness in the chest, shoulders, arms, or jaw, nausea, shortness of breath, cold or clammy skin, feeling faint or lightheaded Heart failure--shortness of breath, swelling of the ankles, feet, or hands, sudden weight gain, unusual weakness or fatigue Increase in blood pressure Infection--fever, chills, cough, sore throat, wounds that don't heal, pain or trouble when passing urine, general feeling of discomfort or being unwell Infusion reactions--chest pain, shortness of breath or trouble breathing, feeling faint or lightheaded Kidney injury--decrease in the amount of urine, swelling of the ankles, hands, or feet Stomach pain that is severe, does not go away, or gets worse Stroke--sudden numbness or weakness of the face, arm, or leg, trouble speaking, confusion, trouble walking, loss of balance or coordination, dizziness, severe headache, change in vision Sudden and severe headache, confusion, change in vision, seizures, which may be signs of posterior reversible encephalopathy syndrome  (PRES) Side effects that usually do not require medical attention (report to your care team if they continue or are bothersome): Back pain Change in taste Diarrhea Dry skin Increased tears Nosebleed This list may not describe all possible side effects. Call your doctor for medical advice about side effects. You may report side effects to FDA at 1-800-FDA-1088. Where should I keep my medication? This medication is given in a hospital or clinic. It will not be stored at home. NOTE: This sheet is a summary. It may not cover all possible information. If you have questions about this medicine, talk to your doctor, pharmacist, or health care provider.  2024 Elsevier/Gold Standard (2021-05-15 00:00:00)

## 2023-02-04 ENCOUNTER — Encounter: Payer: Self-pay | Admitting: Oncology

## 2023-02-08 ENCOUNTER — Other Ambulatory Visit: Payer: Self-pay

## 2023-02-09 DIAGNOSIS — C220 Liver cell carcinoma: Secondary | ICD-10-CM | POA: Diagnosis not present

## 2023-02-09 DIAGNOSIS — F4323 Adjustment disorder with mixed anxiety and depressed mood: Secondary | ICD-10-CM | POA: Diagnosis not present

## 2023-02-09 NOTE — Progress Notes (Signed)
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 Orlie Dakin @ [facility] for fatigue/insomnia. Aaron Lowery is interested in psychiatric medication recommendations to treat his depressive symptoms.  Assessments:  Aaron Lowery scored PHQ-9=9 (mild), reporting daily anhedonia, trouble falling & staying asleep, and hypersomnia.  Aaron Lowery scored GAD-7=6 (mild), reporting nervousness, irritability, worries w/ ability to control, restlessness, and feeling afraid.   Psych Tx/Hx: Aaron Lowery is prescribed Trazodone 100mg  qHS and Hydroxyzine 50mg  qHS for sleep, and alternates between these two meds at random w/o notable benefit as of late. He denies hx of depression/anxiety.   Psychosocial Hx: [name] lives at home with his wife Elease Hashimoto. 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 Dontrez has 4 adult children, 4 sisters, and 1 late brother.   Assigned Dx: Aaron Lowery: Adjustment disorder with mixed anxiety and depressed mood  --  Marissa Nestle [facility] Manager

## 2023-02-15 ENCOUNTER — Other Ambulatory Visit: Payer: Self-pay

## 2023-02-18 ENCOUNTER — Encounter: Payer: Self-pay | Admitting: Oncology

## 2023-02-19 ENCOUNTER — Other Ambulatory Visit: Payer: Self-pay | Admitting: Oncology

## 2023-02-21 ENCOUNTER — Encounter: Payer: Self-pay | Admitting: Oncology

## 2023-02-23 ENCOUNTER — Other Ambulatory Visit: Payer: Self-pay

## 2023-02-24 ENCOUNTER — Inpatient Hospital Stay: Payer: Medicare Other | Attending: Oncology

## 2023-02-24 ENCOUNTER — Ambulatory Visit: Payer: 59

## 2023-02-24 ENCOUNTER — Encounter: Payer: Self-pay | Admitting: Oncology

## 2023-02-24 ENCOUNTER — Inpatient Hospital Stay (HOSPITAL_BASED_OUTPATIENT_CLINIC_OR_DEPARTMENT_OTHER): Payer: Medicare Other | Admitting: Oncology

## 2023-02-24 VITALS — BP 134/103 | HR 96 | Temp 97.9°F | Ht 67.0 in | Wt 186.0 lb

## 2023-02-24 DIAGNOSIS — C220 Liver cell carcinoma: Secondary | ICD-10-CM | POA: Insufficient documentation

## 2023-02-24 DIAGNOSIS — Z79899 Other long term (current) drug therapy: Secondary | ICD-10-CM | POA: Insufficient documentation

## 2023-02-24 DIAGNOSIS — Z5112 Encounter for antineoplastic immunotherapy: Secondary | ICD-10-CM | POA: Insufficient documentation

## 2023-02-24 DIAGNOSIS — Z87891 Personal history of nicotine dependence: Secondary | ICD-10-CM | POA: Diagnosis not present

## 2023-02-24 DIAGNOSIS — D649 Anemia, unspecified: Secondary | ICD-10-CM | POA: Insufficient documentation

## 2023-02-24 LAB — CBC WITH DIFFERENTIAL (CANCER CENTER ONLY)
Abs Immature Granulocytes: 0.03 10*3/uL (ref 0.00–0.07)
Basophils Absolute: 0 10*3/uL (ref 0.0–0.1)
Basophils Relative: 0 %
Eosinophils Absolute: 0.4 10*3/uL (ref 0.0–0.5)
Eosinophils Relative: 5 %
HCT: 40.5 % (ref 39.0–52.0)
Hemoglobin: 12.9 g/dL — ABNORMAL LOW (ref 13.0–17.0)
Immature Granulocytes: 0 %
Lymphocytes Relative: 33 %
Lymphs Abs: 2.6 10*3/uL (ref 0.7–4.0)
MCH: 22.6 pg — ABNORMAL LOW (ref 26.0–34.0)
MCHC: 31.9 g/dL (ref 30.0–36.0)
MCV: 70.9 fL — ABNORMAL LOW (ref 80.0–100.0)
Monocytes Absolute: 1.1 10*3/uL — ABNORMAL HIGH (ref 0.1–1.0)
Monocytes Relative: 14 %
Neutro Abs: 3.8 10*3/uL (ref 1.7–7.7)
Neutrophils Relative %: 48 %
Platelet Count: 307 10*3/uL (ref 150–400)
RBC: 5.71 MIL/uL (ref 4.22–5.81)
RDW: 15.9 % — ABNORMAL HIGH (ref 11.5–15.5)
WBC Count: 7.9 10*3/uL (ref 4.0–10.5)
nRBC: 0 % (ref 0.0–0.2)

## 2023-02-24 LAB — CMP (CANCER CENTER ONLY)
ALT: 20 U/L (ref 0–44)
AST: 20 U/L (ref 15–41)
Albumin: 4.2 g/dL (ref 3.5–5.0)
Alkaline Phosphatase: 46 U/L (ref 38–126)
Anion gap: 9 (ref 5–15)
BUN: 19 mg/dL (ref 8–23)
CO2: 25 mmol/L (ref 22–32)
Calcium: 9.8 mg/dL (ref 8.9–10.3)
Chloride: 102 mmol/L (ref 98–111)
Creatinine: 1.18 mg/dL (ref 0.61–1.24)
GFR, Estimated: >60 mL/min (ref >60)
Glucose, Bld: 119 mg/dL — ABNORMAL HIGH (ref 70–99)
Potassium: 4.3 mmol/L (ref 3.5–5.1)
Sodium: 136 mmol/L (ref 135–145)
Total Bilirubin: 0.5 mg/dL (ref 0.0–1.2)
Total Protein: 7.8 g/dL (ref 6.5–8.1)

## 2023-02-24 MED ORDER — SODIUM CHLORIDE 0.9 % IV SOLN
1200.0000 mg | Freq: Once | INTRAVENOUS | Status: AC
  Administered 2023-02-24: 1200 mg via INTRAVENOUS
  Filled 2023-02-24: qty 20

## 2023-02-24 MED ORDER — SODIUM CHLORIDE 0.9 % IV SOLN
15.0000 mg/kg | Freq: Once | INTRAVENOUS | Status: AC
  Administered 2023-02-24: 1200 mg via INTRAVENOUS
  Filled 2023-02-24: qty 48

## 2023-02-24 MED ORDER — SODIUM CHLORIDE 0.9 % IV SOLN
INTRAVENOUS | Status: DC
  Filled 2023-02-24: qty 250

## 2023-02-24 NOTE — Patient Instructions (Signed)
[facility]  Discharge Instructions: Thank you for choosing [facility] to provide your oncology and hematology care.  If you have a lab appointment with the Cancer Center, please go directly to the Cancer Center and check in at the registration area.  Wear comfortable clothing and clothing appropriate for easy access to any Portacath or PICC line.   We strive to give you quality time with your provider. You may need to reschedule your appointment if you arrive late (15 or more minutes).  Arriving late affects you and other patients whose appointments are after yours.  Also, if you miss three or more appointments without notifying the office, you may be dismissed from the clinic at the provider's discretion.      For prescription refill requests, have your pharmacy contact our office and allow 72 hours for refills to be completed.    Today you received the following chemotherapy and/or immunotherapy agents- tecentriq, avastin      To help prevent nausea and vomiting after your treatment, we encourage you to take your nausea medication as directed.  BELOW ARE SYMPTOMS THAT SHOULD BE REPORTED IMMEDIATELY: *FEVER GREATER THAN 100.4 F (38 C) OR HIGHER *CHILLS OR SWEATING *NAUSEA AND VOMITING THAT IS NOT CONTROLLED WITH YOUR NAUSEA MEDICATION *UNUSUAL SHORTNESS OF BREATH *UNUSUAL BRUISING OR BLEEDING *URINARY PROBLEMS (pain or burning when urinating, or frequent urination) *BOWEL PROBLEMS (unusual diarrhea, constipation, pain near the anus) TENDERNESS IN MOUTH AND THROAT WITH OR WITHOUT PRESENCE OF ULCERS (sore throat, sores in mouth, or a toothache) UNUSUAL RASH, SWELLING OR PAIN  UNUSUAL VAGINAL DISCHARGE OR ITCHING   Items with * indicate a potential emergency and should be followed up as soon as possible or go to the Emergency Department if any problems should occur.  Please show the CHEMOTHERAPY ALERT CARD or  IMMUNOTHERAPY ALERT CARD at check-in to the Emergency Department and triage nurse.  Should you have questions after your visit or need to cancel or reschedule your appointment, please contact [facility] - A DEPT OF [facility]  [phone] and follow the prompts.  Office hours are 8:00 a.m. to 4:30 p.m. Monday - Friday. Please note that voicemails left after 4:00 p.m. may not be returned until the following business day.  We are closed weekends and major holidays. You have access to a nurse at all times for urgent questions. Please call the main number to the clinic [phone] and follow the prompts.  For any non-urgent questions, you may also contact your provider using MyChart. We now offer e-Visits for anyone 77 and older to request care online for non-urgent symptoms. For details visit [email or web address].   Also download the MyChart app! Go to the app store, search "MyChart", open the app, select [place], and log in with your MyChart username and password.

## 2023-02-24 NOTE — Progress Notes (Unsigned)
[facility]  [phone] [phone]  ID: Aaron Lowery OB: [birth year]  MR#: [id number]  NGE#:[id number]  Patient Care Team: [facility] as PCP - General Benita Gutter, RN as Oncology Nurse Navigator Orlie Dakin, Tollie Pizza, MD as Consulting Physician (Oncology)  CHIEF COMPLAINT: Stage IVa hepatocellular carcinoma.  INTERVAL HISTORY: Patient returns to clinic today for further evaluation and consideration of cycle 5 of Tecentriq and Avastin.  Patient states his insomnia is mildly improved.  He otherwise feels well and is tolerating his treatments without significant side effects. He has no neurologic complaints.  He denies any recent fevers or illnesses.  He has a good appetite and denies any further weight loss.  He has no chest pain, shortness of breath, cough, or hemoptysis.  He denies any nausea, vomiting, constipation, or diarrhea.  He has no melena or hematochezia.  He denies any urinary complaints.  Patient offers no further specific complaints today.  REVIEW OF SYSTEMS:   Review of Systems  Constitutional: Negative.  Negative for fever, malaise/fatigue and weight loss.  Respiratory: Negative.  Negative for cough, hemoptysis and shortness of breath.   Cardiovascular: Negative.  Negative for chest pain and leg swelling.  Gastrointestinal: Negative.  Negative for abdominal pain, blood in stool and melena.  Genitourinary: Negative.  Negative for dysuria.  Musculoskeletal: Negative.  Negative for back pain and joint pain.  Skin: Negative.  Negative for rash.  Neurological: Negative.  Negative for dizziness, focal weakness, weakness and headaches.  Psychiatric/Behavioral:  The patient has insomnia. The patient is not nervous/anxious.     As per HPI. Otherwise, a complete review of systems is negative.  PAST MEDICAL HISTORY: Past Medical History:  Diagnosis Date   Hypercholesteremia    Hypertension     PAST SURGICAL  HISTORY: History reviewed. No pertinent surgical history.  FAMILY HISTORY: History reviewed. No pertinent family history.  ADVANCED DIRECTIVES (Y/N):  N  HEALTH MAINTENANCE: Social History   Tobacco Use   Smoking status: Former    Types: Cigarettes   Smokeless tobacco: Never  Vaping Use   Vaping status: Never Used  Substance Use Topics   Alcohol use: Yes    Alcohol/week: 3.0 standard drinks of alcohol    Types: 3 Cans of beer per week   Drug use: Never     Colonoscopy:  PAP:  Bone density:  Lipid panel:  No Known Allergies  Current Outpatient Medications  Medication Sig Dispense Refill   hydrochlorothiazide (HYDRODIURIL) 12.5 MG tablet Take by mouth.     ibuprofen (ADVIL) 800 MG tablet Take 800 mg by mouth every 6 (six) hours as needed.     levocetirizine (XYZAL) 5 MG tablet Take 5 mg by mouth every evening.     losartan (COZAAR) 50 MG tablet Take 50 mg by mouth 2 (two) times daily.     mirtazapine (REMERON) 15 MG tablet TAKE 1 TABLET BY MOUTH EVERYDAY AT BEDTIME 90 tablet 1   pravastatin (PRAVACHOL) 40 MG tablet Take 40 mg by mouth at bedtime.     prednisoLONE acetate (PRED FORTE) 1 % ophthalmic suspension Place 1 drop into the right eye every 4 (four) hours.     prochlorperazine (COMPAZINE) 10 MG tablet Take 1 tablet (10 mg total) by mouth every 6 (six) hours as needed for nausea or vomiting. 60 tablet 1   Travoprost, BAK Free, (TRAVATAN) 0.004 % SOLN ophthalmic solution Place 1 drop into both eyes at bedtime.     traZODone (DESYREL) 100  MG tablet Take 100 mg by mouth at bedtime.     trimethoprim-polymyxin b (POLYTRIM) ophthalmic solution Place 1 drop into the right eye every 4 (four) hours.     furosemide (LASIX) 20 MG tablet Take by mouth. (Patient not taking: Reported on 02/24/2023)     No current facility-administered medications for this visit.    OBJECTIVE: Vitals:   02/24/23 1345  BP: (!) 134/103  Pulse: 96  Temp: 97.9 F (36.6 C)  SpO2: 97%     Body  mass index is 29.13 kg/m.    ECOG FS:0 - Asymptomatic  General: Well-developed, well-nourished, no acute distress. Eyes: Pink conjunctiva, anicteric sclera. HEENT: Normocephalic, moist mucous membranes. Lungs: No audible wheezing or coughing. Heart: Regular rate and rhythm. Abdomen: Soft, nontender, no obvious distention. Musculoskeletal: No edema, cyanosis, or clubbing. Neuro: Alert, answering all questions appropriately. Cranial nerves grossly intact. Skin: No rashes or petechiae noted. Psych: Normal affect.  LAB RESULTS:  Lab Results  Component Value Date   NA 136 02/24/2023   K 4.3 02/24/2023   CL 102 02/24/2023   CO2 25 02/24/2023   GLUCOSE 119 (H) 02/24/2023   BUN 19 02/24/2023   CREATININE 1.18 02/24/2023   CALCIUM 9.8 02/24/2023   PROT 7.8 02/24/2023   ALBUMIN 4.2 02/24/2023   AST 20 02/24/2023   ALT 20 02/24/2023   ALKPHOS 46 02/24/2023   BILITOT 0.5 02/24/2023   GFRNONAA >60 02/24/2023    Lab Results  Component Value Date   WBC 7.9 02/24/2023   NEUTROABS 3.8 02/24/2023   HGB 12.9 (L) 02/24/2023   HCT 40.5 02/24/2023   MCV 70.9 (L) 02/24/2023   PLT 307 02/24/2023     STUDIES: No results found.  ASSESSMENT: Stage IVa hepatocellular carcinoma.  PLAN:    Stage IVa hepatocellular carcinoma: Diagnosis confirmed by liver biopsy on November 05, 2022.  PET scan results from November 01, 2022 reviewed independently and reported as above with 7.8 cm hypermetabolic liver mass as well as a hypermetabolic lymph node suspicious for metastasis. AFP is only mildly elevated at 38.9.  Case discussed at tumor board and while patient is likely not a transplant candidate he may be a candidate for direct ablation if lesion can be decreased in size.  Patient has agreed to systemic treatment using Tecentriq and Avastin every 3 weeks.  Proceed with cycle 5 of treatment today.  Return to clinic in 3 weeks for further evaluation and consideration of cycle 6.  Will reimage with PET  scan after cycle 6.    Anemia: Essentially solved. Joint pain: Patient does not complain of this today.  Patient reports he uses ibuprofen sparingly. Fatigue/insomnia: Improved.  Patient was given referrals to both the [facility] program and [facility].  Continue Remeron 15 mg at night. Hyponatremia: Resolved.  Patient expressed understanding and was in agreement with this plan. He also understands that He can call clinic at any time with any questions, concerns, or complaints.    Cancer Staging  Hepatocellular carcinoma [facility]) Staging form: Liver, AJCC 8th Edition - Clinical stage from 11/18/2022: Stage IVA (cT1b, cN1, cM0) - Signed by Jeralyn Ruths, MD on 11/18/2022 Stage prefix: Initial diagnosis   Jeralyn Ruths, MD   02/25/2023 9:55 AM

## 2023-02-25 ENCOUNTER — Encounter: Payer: Self-pay | Admitting: Oncology

## 2023-03-01 ENCOUNTER — Encounter: Payer: Medicare Other | Attending: Oncology

## 2023-03-01 VITALS — Ht 66.9 in | Wt 177.3 lb

## 2023-03-01 DIAGNOSIS — C220 Liver cell carcinoma: Secondary | ICD-10-CM

## 2023-03-01 NOTE — Patient Instructions (Signed)
 Patient Instructions  Patient Details  Name: Aaron Lowery MRN: [id number] Date of Birth: 1950-07-07 Referring Provider:  [facility]  Below are your personal goals for exercise, nutrition, and risk factors. Our goal is to help you stay on track towards obtaining and maintaining these goals. We will be discussing your progress on these goals with you throughout the program.  Initial Exercise Prescription:  Initial Exercise Prescription - 03/01/23 1400       Date of Initial Exercise RX and Referring Provider   Date 03/01/23    Referring Provider Gerarda Fraction, MD      Oxygen   Maintain Oxygen Saturation 88% or higher      Treadmill   MPH 1.5    Grade 0    Minutes 15    METs 2.15      Recumbant Bike   Level 2    RPM 50    Watts 15    Minutes 15    METs 2.2      NuStep   Level 2   T6   SPM 80    Minutes 15    METs 2.2      REL-XR   Level 1    Speed 50    Minutes 15    METs 2.2      Prescription Details   Frequency (times per week) 2    Duration Progress to 30 minutes of continuous aerobic without signs/symptoms of physical distress      Intensity   THRR 40-80% of Max Heartrate 118-137    Ratings of Perceived Exertion 11-13    Perceived Dyspnea 0-4      Progression   Progression Continue to progress workloads to maintain intensity without signs/symptoms of physical distress.      Resistance Training   Training Prescription Yes    Weight 5 lb    Reps 10-15             Exercise Goals: Frequency: Be able to perform aerobic exercise two to three times per week in program working toward 2-5 days per week of home exercise.  Intensity: Work with a perceived exertion of 11 (fairly light) - 15 (hard) while following your exercise prescription.  We will make changes to your prescription with you as you progress through the program.   Duration: Be able to do 30 to 45 minutes of continuous aerobic exercise in addition to a 5 minute warm-up and  a 5 minute cool-down routine.   Nutrition Goals: Your personal nutrition goals will be established when you do your nutrition analysis with the dietician.  The following are general nutrition guidelines to follow: Cholesterol < 200mg /day Sodium < 1500mg /day Fiber: Men over 50 yrs - 30 grams per day  Personal Goals:   Tobacco Use Initial Evaluation: Social History   Tobacco Use  Smoking Status Former   Types: Cigarettes  Smokeless Tobacco Never    Exercise Goals and Review:  Exercise Goals     Row Name 03/01/23 1406             Exercise Goals   Increase Physical Activity Yes       Intervention Provide advice, education, support and counseling about physical activity/exercise needs.;Develop an individualized exercise prescription for aerobic and resistive training based on initial evaluation findings, risk stratification, comorbidities and participant's personal goals.       Expected Outcomes Short Term: Attend rehab on a regular basis to increase amount of physical activity.;Long Term: Add in home  exercise to make exercise part of routine and to increase amount of physical activity.;Long Term: Exercising regularly at least 3-5 days a week.       Increase Strength and Stamina Yes       Intervention Provide advice, education, support and counseling about physical activity/exercise needs.;Develop an individualized exercise prescription for aerobic and resistive training based on initial evaluation findings, risk stratification, comorbidities and participant's personal goals.       Expected Outcomes Short Term: Increase workloads from initial exercise prescription for resistance, speed, and METs.;Short Term: Perform resistance training exercises routinely during rehab and add in resistance training at home;Long Term: Improve cardiorespiratory fitness, muscular endurance and strength as measured by increased METs and functional capacity ( )       Able to understand and use rate of  perceived exertion (RPE) scale Yes       Intervention Provide education and explanation on how to use RPE scale       Expected Outcomes Short Term: Able to use RPE daily in rehab to express subjective intensity level;Long Term:  Able to use RPE to guide intensity level when exercising independently       Able to understand and use Dyspnea scale Yes       Intervention Provide education and explanation on how to use Dyspnea scale       Expected Outcomes Short Term: Able to use Dyspnea scale daily in rehab to express subjective sense of shortness of breath during exertion;Long Term: Able to use Dyspnea scale to guide intensity level when exercising independently       Knowledge and understanding of Target Heart Rate Range (THRR) Yes       Intervention Provide education and explanation of THRR including how the numbers were predicted and where they are located for reference       Expected Outcomes Short Term: Able to state/look up THRR;Short Term: Able to use daily as guideline for intensity in rehab;Long Term: Able to use THRR to govern intensity when exercising independently       Able to check pulse independently Yes       Intervention Provide education and demonstration on how to check pulse in carotid and radial arteries.;Review the importance of being able to check your own pulse for safety during independent exercise       Expected Outcomes Short Term: Able to explain why pulse checking is important during independent exercise;Long Term: Able to check pulse independently and accurately       Understanding of Exercise Prescription Yes       Intervention Provide education, explanation, and written materials on patient's individual exercise prescription       Expected Outcomes Short Term: Able to explain program exercise prescription;Long Term: Able to explain home exercise prescription to exercise independently

## 2023-03-01 NOTE — Progress Notes (Signed)
 Daily Session Note  Patient Details  Name: Aaron Lowery MRN: [id number] Date of Birth: 09-15-1950 Referring Provider:   Doristine Devoid [facility] Associated Rehabilitation & Exercise from 03/01/2023 in [facility] Cardiac and Pulmonary Rehab  Referring Provider Gerarda Fraction, MD       Encounter Date: 03/01/2023  Check In:  Session Check In - 03/01/23 1351       Check-In   Supervising physician immediately available to respond to emergencies See telemetry face sheet for immediately available ER MD    Location [facility]-Cardiac & Pulmonary Rehab    Staff Present Rory Percy, MS, Exercise Physiologist;Noah Tickle, BS, Exercise Physiologist    Virtual Visit No    Medication changes reported     No    Fall or balance concerns reported    No    Warm-up and Cool-down Performed on first and last piece of equipment   and   Resistance Training Performed Yes    VAD Patient? No    PAD/SET Patient? No      Pain Assessment   Currently in Pain? No/denies    Multiple Pain Sites No               Exercise Prescription Changes - 03/01/23 1400       Response to Exercise   Blood Pressure (Admit) 120/88    Blood Pressure (Exercise) 140/90    Blood Pressure (Exit) 130/88    Heart Rate (Admit) 100 bpm    Heart Rate (Exercise) 111 bpm    Heart Rate (Exit) 102 bpm    Oxygen Saturation (Admit) 94 %    Oxygen Saturation (Exercise) 94 %    Oxygen Saturation (Exit) 95 %    Rating of Perceived Exertion (Exercise) 11    Perceived Dyspnea (Exercise) 1    Symptoms none    Comments results    Duration Progress to 30 minutes of  aerobic without signs/symptoms of physical distress    Intensity THRR New      Progression   Progression Continue to progress workloads to maintain intensity without signs/symptoms of physical distress.    Average METs 2.2             Social History   Tobacco Use  Smoking Status Former   Types: Cigarettes  Smokeless Tobacco Never    Goals Met:   Independence with exercise equipment Exercise tolerated well No report of concerns or symptoms today  Goals Unmet:  Not Applicable  Comments: First day of exercise!  Patient was oriented to gym and equipment including functions, settings, policies, and procedures. 6 minute walk test was completed today and results are below.  Patient's individual exercise prescription and treatment plan were reviewed.  All starting workloads were established based on the results of the 6 minute walk test done.  The plan for exercise progression was also introduced and progression will be customized based on patient's performance and goals.    6 Minute Walk     Row Name 03/01/23 1357         6 Minute Walk   Phase Initial     Distance 900 feet     Walk Time 6 minutes     # of Rest Breaks 0     MPH 1.7     METS 2.18     RPE 11     Perceived Dyspnea  1     VO2 Peak 7.61     Symptoms No     Resting HR 100  bpm     Resting BP 120/88     Resting Oxygen Saturation  94 %     Exercise Oxygen Saturation  during 6 min walk 94 %     Max Ex. HR 111 bpm     Max Ex. BP 140/90     2 Minute Post BP 130/88                Dr. Bethann Punches is Medical Director for [facility] Cardiac Rehabilitation.  Dr. Vida Rigger is Medical Director for [facility] Pulmonary Rehabilitation.

## 2023-03-02 ENCOUNTER — Other Ambulatory Visit: Payer: Self-pay

## 2023-03-04 DIAGNOSIS — F4323 Adjustment disorder with mixed anxiety and depressed mood: Secondary | ICD-10-CM | POA: Insufficient documentation

## 2023-03-09 ENCOUNTER — Encounter: Payer: Self-pay | Admitting: Oncology

## 2023-03-10 ENCOUNTER — Encounter: Payer: Self-pay | Admitting: Oncology

## 2023-03-11 DIAGNOSIS — F4323 Adjustment disorder with mixed anxiety and depressed mood: Secondary | ICD-10-CM | POA: Diagnosis not present

## 2023-03-11 DIAGNOSIS — C220 Liver cell carcinoma: Secondary | ICD-10-CM | POA: Diagnosis not present

## 2023-03-12 NOTE — Progress Notes (Signed)
 [facility] Medication Recommendation  Dear Referring Specialist:  Thank you for referring your patient to [facility]. Below you will find my new psychiatric medication recommendations.  Once reviewed, please confirm prescription(s) within 2 business days. If you modify or decline the recommendations, please document these modifications and notify us. If you have any questions or concerns, please call our [facility] provider line at [phone].  Best,   Demetria Pore, MD  Name- Ana, Liaw Date of Birth- [birth year] Referring Provider: Gerarda Fraction, MD Patient MRN: [id number]  Most recent Assessments: Depression (PHQ-9) 01/26/2023 9 Anxiety (GAD-7) 01/26/2023 6   Medication Recommendations: Psychiatric Provider recommends that the Referring Specialist prescribe the below medications....  decrease mirtazapine dose from 15mg  to 7.5mg  nightly Reason for Recommendation: lower doses can be more sedating, which is what the patient needs

## 2023-03-14 ENCOUNTER — Other Ambulatory Visit: Payer: Self-pay | Admitting: Oncology

## 2023-03-14 MED ORDER — MIRTAZAPINE 7.5 MG PO TABS: 7.5000 mg | ORAL_TABLET | Freq: Every day | ORAL | 1 refills | Status: AC

## 2023-03-15 ENCOUNTER — Ambulatory Visit: Payer: Medicare Other

## 2023-03-15 DIAGNOSIS — C220 Liver cell carcinoma: Secondary | ICD-10-CM

## 2023-03-15 NOTE — Progress Notes (Signed)
 Daily Session Note  Patient Details  Name: Aaron Lowery MRN: [id number] Date of Birth: December 27, 1950 Referring Provider:   Doristine Devoid Cancer Associated Rehabilitation & Exercise from 03/01/2023 in [facility] Cardiac and Pulmonary Rehab  Referring Provider Gerarda Fraction, MD       Encounter Date: 03/15/2023  Check In:  Session Check In - 03/15/23 1228       Check-In   Supervising physician immediately available to respond to emergencies See telemetry face sheet for immediately available ER MD    Location [facility]-Cardiac & Pulmonary Rehab    Staff Present Rory Percy, MS, Exercise Physiologist;Noah Tickle, BS, Exercise Physiologist    Virtual Visit No    Medication changes reported     No    Fall or balance concerns reported    No    Warm-up and Cool-down Performed on first and last piece of equipment    Resistance Training Performed Yes    VAD Patient? No    PAD/SET Patient? No      Pain Assessment   Currently in Pain? No/denies    Multiple Pain Sites No                Social History   Tobacco Use  Smoking Status Former   Types: Cigarettes  Smokeless Tobacco Never    Goals Met:  Independence with exercise equipment Exercise tolerated well No report of concerns or symptoms today  Goals Unmet:  Not Applicable  Comments: Pt able to follow exercise prescription today without complaint.  Will continue to monitor for progression.    Dr. Bethann Punches is Medical Director for [facility] Cardiac Rehabilitation.  Dr. Vida Rigger is Medical Director for [facility] Pulmonary Rehabilitation.

## 2023-03-17 ENCOUNTER — Inpatient Hospital Stay: Payer: Medicaid Other

## 2023-03-17 ENCOUNTER — Encounter: Payer: Self-pay | Admitting: Oncology

## 2023-03-17 ENCOUNTER — Inpatient Hospital Stay: Payer: Medicaid Other | Attending: Oncology | Admitting: Oncology

## 2023-03-17 VITALS — BP 127/87 | HR 86

## 2023-03-17 VITALS — BP 126/90 | HR 101 | Temp 97.0°F | Resp 18 | Ht 66.9 in | Wt 193.7 lb

## 2023-03-17 DIAGNOSIS — Z79899 Other long term (current) drug therapy: Secondary | ICD-10-CM | POA: Insufficient documentation

## 2023-03-17 DIAGNOSIS — G4700 Insomnia, unspecified: Secondary | ICD-10-CM | POA: Diagnosis not present

## 2023-03-17 DIAGNOSIS — C220 Liver cell carcinoma: Secondary | ICD-10-CM

## 2023-03-17 DIAGNOSIS — Z87891 Personal history of nicotine dependence: Secondary | ICD-10-CM | POA: Diagnosis not present

## 2023-03-17 DIAGNOSIS — D649 Anemia, unspecified: Secondary | ICD-10-CM | POA: Diagnosis present

## 2023-03-17 DIAGNOSIS — Z5112 Encounter for antineoplastic immunotherapy: Secondary | ICD-10-CM | POA: Insufficient documentation

## 2023-03-17 LAB — TOTAL PROTEIN, URINE DIPSTICK: Protein, ur: NEGATIVE mg/dL

## 2023-03-17 LAB — CBC WITH DIFFERENTIAL (CANCER CENTER ONLY)
Abs Immature Granulocytes: 0.02 10*3/uL (ref 0.00–0.07)
Basophils Absolute: 0 10*3/uL (ref 0.0–0.1)
Basophils Relative: 0 %
Eosinophils Absolute: 0.4 10*3/uL (ref 0.0–0.5)
Eosinophils Relative: 5 %
HCT: 39.4 % (ref 39.0–52.0)
Hemoglobin: 12.5 g/dL — ABNORMAL LOW (ref 13.0–17.0)
Immature Granulocytes: 0 %
Lymphocytes Relative: 31 %
Lymphs Abs: 2.3 10*3/uL (ref 0.7–4.0)
MCH: 22.6 pg — ABNORMAL LOW (ref 26.0–34.0)
MCHC: 31.7 g/dL (ref 30.0–36.0)
MCV: 71.2 fL — ABNORMAL LOW (ref 80.0–100.0)
Monocytes Absolute: 0.8 10*3/uL (ref 0.1–1.0)
Monocytes Relative: 10 %
Neutro Abs: 3.9 10*3/uL (ref 1.7–7.7)
Neutrophils Relative %: 54 %
Platelet Count: 306 10*3/uL (ref 150–400)
RBC: 5.53 MIL/uL (ref 4.22–5.81)
RDW: 15.5 % (ref 11.5–15.5)
WBC Count: 7.4 10*3/uL (ref 4.0–10.5)
nRBC: 0 % (ref 0.0–0.2)

## 2023-03-17 LAB — TSH: TSH: 3.581 u[IU]/mL (ref 0.350–4.500)

## 2023-03-17 LAB — CMP (CANCER CENTER ONLY)
ALT: 17 U/L (ref 0–44)
AST: 19 U/L (ref 15–41)
Albumin: 3.7 g/dL (ref 3.5–5.0)
Alkaline Phosphatase: 46 U/L (ref 38–126)
Anion gap: 11 (ref 5–15)
BUN: 21 mg/dL (ref 8–23)
CO2: 21 mmol/L — ABNORMAL LOW (ref 22–32)
Calcium: 9 mg/dL (ref 8.9–10.3)
Chloride: 103 mmol/L (ref 98–111)
Creatinine: 1.16 mg/dL (ref 0.61–1.24)
GFR, Estimated: >60 mL/min (ref >60)
Glucose, Bld: 296 mg/dL — ABNORMAL HIGH (ref 70–99)
Potassium: 3.8 mmol/L (ref 3.5–5.1)
Sodium: 135 mmol/L (ref 135–145)
Total Bilirubin: 0.4 mg/dL (ref 0.0–1.2)
Total Protein: 7.4 g/dL (ref 6.5–8.1)

## 2023-03-17 MED ORDER — SODIUM CHLORIDE 0.9 % IV SOLN
INTRAVENOUS | Status: DC
  Filled 2023-03-17: qty 250

## 2023-03-17 MED ORDER — PROCHLORPERAZINE MALEATE 10 MG PO TABS: 10.0000 mg | ORAL_TABLET | Freq: Four times a day (QID) | ORAL | 1 refills | Status: DC | PRN

## 2023-03-17 MED ORDER — SODIUM CHLORIDE 0.9 % IV SOLN
15.0000 mg/kg | Freq: Once | INTRAVENOUS | Status: AC
  Administered 2023-03-17: 1200 mg via INTRAVENOUS
  Filled 2023-03-17: qty 48

## 2023-03-17 MED ORDER — SODIUM CHLORIDE 0.9 % IV SOLN
1200.0000 mg | Freq: Once | INTRAVENOUS | Status: AC
  Administered 2023-03-17: 1200 mg via INTRAVENOUS
  Filled 2023-03-17: qty 20

## 2023-03-17 NOTE — Progress Notes (Signed)
 [facility]  Telephone:(336) [phone] Fax:(336) [phone]  ID: Aaron Lowery OB: 05/07/1950  MR#: [id number]  AOZ#:[id number]  Patient Care Team: [facility] as PCP - General Benita Gutter, RN as Oncology Nurse Navigator Orlie Dakin, Tollie Pizza, MD as Consulting Physician (Oncology)  CHIEF COMPLAINT: Stage IVa hepatocellular carcinoma.  INTERVAL HISTORY: Patient returns to clinic today for further evaluation and consideration of cycle 6 of Tecentriq and Avastin.  He continues to have insomnia.  He is complaining of increased appetite, weight gain, and shortness of breath.  He also has decreased sex drive.   He has no neurologic complaints.  He denies any recent fevers or illnesses. He has no chest pain, cough, or hemoptysis.  He denies any nausea, vomiting, constipation, or diarrhea.  He has no melena or hematochezia.  He denies any urinary complaints.  Patient offers no further specific complaints today.  REVIEW OF SYSTEMS:   Review of Systems  Constitutional: Negative.  Negative for fever, malaise/fatigue and weight loss.  Respiratory:  Positive for shortness of breath. Negative for cough and hemoptysis.   Cardiovascular: Negative.  Negative for chest pain and leg swelling.  Gastrointestinal: Negative.  Negative for abdominal pain, blood in stool and melena.  Genitourinary: Negative.  Negative for dysuria.  Musculoskeletal: Negative.  Negative for back pain and joint pain.  Skin: Negative.  Negative for rash.  Neurological: Negative.  Negative for dizziness, focal weakness, weakness and headaches.  Psychiatric/Behavioral:  The patient has insomnia. The patient is not nervous/anxious.     As per HPI. Otherwise, a complete review of systems is negative.  PAST MEDICAL HISTORY: Past Medical History:  Diagnosis Date   Hypercholesteremia    Hypertension     PAST SURGICAL HISTORY: History reviewed. No pertinent surgical history.  FAMILY  HISTORY: History reviewed. No pertinent family history.  ADVANCED DIRECTIVES (Y/N):  N  HEALTH MAINTENANCE: Social History   Tobacco Use   Smoking status: Former    Types: Cigarettes   Smokeless tobacco: Never  Vaping Use   Vaping status: Never Used  Substance Use Topics   Alcohol use: Yes    Alcohol/week: 3.0 standard drinks of alcohol    Types: 3 Cans of beer per week   Drug use: Never     Colonoscopy:  PAP:  Bone density:  Lipid panel:  No Known Allergies  Current Outpatient Medications  Medication Sig Dispense Refill   furosemide (LASIX) 20 MG tablet Take by mouth.     hydrochlorothiazide (HYDRODIURIL) 12.5 MG tablet Take by mouth.     losartan (COZAAR) 50 MG tablet Take 50 mg by mouth 2 (two) times daily.     mirtazapine (REMERON) 7.5 MG tablet Take 1 tablet (7.5 mg total) by mouth at bedtime. 30 tablet 1   prednisoLONE acetate (PRED FORTE) 1 % ophthalmic suspension Place 1 drop into the right eye every 4 (four) hours.     Travoprost, BAK Free, (TRAVATAN) 0.004 % SOLN ophthalmic solution Place 1 drop into both eyes at bedtime.     traZODone (DESYREL) 100 MG tablet Take 100 mg by mouth at bedtime.     trimethoprim-polymyxin b (POLYTRIM) ophthalmic solution Place 1 drop into the right eye every 4 (four) hours.     ibuprofen (ADVIL) 800 MG tablet Take 800 mg by mouth every 6 (six) hours as needed. (Patient not taking: Reported on 03/17/2023)     levocetirizine (XYZAL) 5 MG tablet Take 5 mg by mouth every evening. (Patient not taking: Reported  on 03/17/2023)     pravastatin (PRAVACHOL) 40 MG tablet Take 40 mg by mouth at bedtime. (Patient not taking: Reported on 03/17/2023)     prochlorperazine (COMPAZINE) 10 MG tablet Take 1 tablet (10 mg total) by mouth every 6 (six) hours as needed for nausea or vomiting. 60 tablet 1   No current facility-administered medications for this visit.   Facility-Administered Medications Ordered in Other Visits  Medication Dose Route Frequency  Provider Last Rate Last Admin   0.9 %  sodium chloride infusion   Intravenous Continuous Jeralyn Ruths, MD 10 mL/hr at 03/17/23 1026 New Bag at 03/17/23 1026   atezolizumab (TECENTRIQ) 1,200 mg in sodium chloride 0.9 % 250 mL chemo infusion  1,200 mg Intravenous Once Jeralyn Ruths, MD 540 mL/hr at 03/17/23 1111 1,200 mg at 03/17/23 1111   bevacizumab (AVASTIN) 1,200 mg in sodium chloride 0.9 % 100 mL chemo infusion  15 mg/kg (Treatment Plan Recorded) Intravenous Once Jeralyn Ruths, MD        OBJECTIVE: Vitals:   03/17/23 0922  BP: (!) 126/90  Pulse: (!) 101  Resp: 18  Temp: (!) 97 F (36.1 C)  SpO2: 99%     Body mass index is 30.43 kg/m.    ECOG FS:0 - Asymptomatic  General: Well-developed, well-nourished, no acute distress. Eyes: Pink conjunctiva, anicteric sclera. HEENT: Normocephalic, moist mucous membranes. Lungs: No audible wheezing or coughing. Heart: Regular rate and rhythm. Abdomen: Soft, nontender, no obvious distention. Musculoskeletal: No edema, cyanosis, or clubbing. Neuro: Alert, answering all questions appropriately. Cranial nerves grossly intact. Skin: No rashes or petechiae noted. Psych: Normal affect.  LAB RESULTS:  Lab Results  Component Value Date   NA 135 03/17/2023   K 3.8 03/17/2023   CL 103 03/17/2023   CO2 21 (L) 03/17/2023   GLUCOSE 296 (H) 03/17/2023   BUN 21 03/17/2023   CREATININE 1.16 03/17/2023   CALCIUM 9.0 03/17/2023   PROT 7.4 03/17/2023   ALBUMIN 3.7 03/17/2023   AST 19 03/17/2023   ALT 17 03/17/2023   ALKPHOS 46 03/17/2023   BILITOT 0.4 03/17/2023   GFRNONAA >60 03/17/2023    Lab Results  Component Value Date   WBC 7.4 03/17/2023   NEUTROABS 3.9 03/17/2023   HGB 12.5 (L) 03/17/2023   HCT 39.4 03/17/2023   MCV 71.2 (L) 03/17/2023   PLT 306 03/17/2023     STUDIES: No results found.  ASSESSMENT: Stage IVa hepatocellular carcinoma.  PLAN:    Stage IVa hepatocellular carcinoma: Diagnosis confirmed by  liver biopsy on November 05, 2022.  PET scan results from November 01, 2022 reviewed independently and reported as above with 7.8 cm hypermetabolic liver mass as well as a hypermetabolic lymph node suspicious for metastasis. AFP is only mildly elevated at 38.9.  Case discussed at tumor board and while patient is likely not a transplant candidate he may be a candidate for direct ablation if lesion can be decreased in size.  Patient has agreed to systemic treatment using Tecentriq and Avastin every 3 weeks.  Proceed with cycle 6 of treatment today.  Return to clinic in 3 weeks for further evaluation and consideration of cycle 7.  Will reimage with PET scan prior to next treatment.   Anemia: Mild. Joint pain: Patient does not complain of this today.  Patient reports he uses ibuprofen sparingly. Weight gain/shortness of breath: Patient states he has a significantly increased appetite therefore he has been instructed to discontinue Remeron. Fatigue/insomnia: Chronic and unchanged.  Patient  was given referrals to both the [facility] program and [facility].  Discontinue Remeron as above.  Patient expressed understanding and was in agreement with this plan. He also understands that He can call clinic at any time with any questions, concerns, or complaints.    Cancer Staging  Hepatocellular carcinoma [facility]) Staging form: Liver, AJCC 8th Edition - Clinical stage from 11/18/2022: Stage IVA (cT1b, cN1, cM0) - Signed by Jeralyn Ruths, MD on 11/18/2022 Stage prefix: Initial diagnosis   Jeralyn Ruths, MD   03/17/2023 11:35 AM

## 2023-03-17 NOTE — Progress Notes (Signed)
 Concerned about weight gain and fluid retention. Does state he has SOB a lot. States sex drive is low and worried about his "hormones" being low since he has no energy and feels very tired. Wonders if he is getting steroid with injections because his appetite has increased.

## 2023-03-17 NOTE — Patient Instructions (Signed)
 STOP taking Mirtazapine (Remeron)

## 2023-03-17 NOTE — Patient Instructions (Signed)
 [facility] - A DEPT OF [facility]  Discharge Instructions: Thank you for choosing [facility] to provide your oncology and hematology care.  If you have a lab appointment with the Cancer Center, please go directly to the Cancer Center and check in at the registration area.  Wear comfortable clothing and clothing appropriate for easy access to any Portacath or PICC line.   We strive to give you quality time with your provider. You may need to reschedule your appointment if you arrive late (15 or more minutes).  Arriving late affects you and other patients whose appointments are after yours.  Also, if you miss three or more appointments without notifying the office, you may be dismissed from the clinic at the provider's discretion.      For prescription refill requests, have your pharmacy contact our office and allow 72 hours for refills to be completed.    Today you received the following chemotherapy and/or immunotherapy agents- tecentriq, avastin      To help prevent nausea and vomiting after your treatment, we encourage you to take your nausea medication as directed.  BELOW ARE SYMPTOMS THAT SHOULD BE REPORTED IMMEDIATELY: *FEVER GREATER THAN 100.4 F (38 C) OR HIGHER *CHILLS OR SWEATING *NAUSEA AND VOMITING THAT IS NOT CONTROLLED WITH YOUR NAUSEA MEDICATION *UNUSUAL SHORTNESS OF BREATH *UNUSUAL BRUISING OR BLEEDING *URINARY PROBLEMS (pain or burning when urinating, or frequent urination) *BOWEL PROBLEMS (unusual diarrhea, constipation, pain near the anus) TENDERNESS IN MOUTH AND THROAT WITH OR WITHOUT PRESENCE OF ULCERS (sore throat, sores in mouth, or a toothache) UNUSUAL RASH, SWELLING OR PAIN  UNUSUAL VAGINAL DISCHARGE OR ITCHING   Items with * indicate a potential emergency and should be followed up as soon as possible or go to the Emergency Department if any problems should occur.  Please show the CHEMOTHERAPY ALERT CARD or  IMMUNOTHERAPY ALERT CARD at check-in to the Emergency Department and triage nurse.  Should you have questions after your visit or need to cancel or reschedule your appointment, please contact [facility] - A DEPT OF [facility]  [phone] and follow the prompts.  Office hours are 8:00 a.m. to 4:30 p.m. Monday - Friday. Please note that voicemails left after 4:00 p.m. may not be returned until the following business day.  We are closed weekends and major holidays. You have access to a nurse at all times for urgent questions. Please call the main number to the clinic [phone] and follow the prompts.  For any non-urgent questions, you may also contact your provider using MyChart. We now offer e-Visits for anyone 77 and older to request care online for non-urgent symptoms. For details visit [email or web address].   Also download the MyChart app! Go to the app store, search "MyChart", open the app, select [facility], and log in with your MyChart username and password.

## 2023-03-18 LAB — T4: T4, Total: 3.3 ug/dL — ABNORMAL LOW (ref 4.5–12.0)

## 2023-03-29 ENCOUNTER — Telehealth: Payer: Self-pay | Admitting: *Deleted

## 2023-03-29 NOTE — Telephone Encounter (Signed)
 Secure chat received from nuclear medicine that patient had called to cancel PET scan and does not want to reschedule. RN called to discuss with patient, he did not answer the phone so left vm with direct number for return call. RN also attempted to communicate to patients wife, she will ask him to call clinic to follow up on rescheduling of PET scan.

## 2023-03-30 ENCOUNTER — Ambulatory Visit
Admission: RE | Admit: 2023-03-30 | Discharge: 2023-03-30 | Disposition: A | Discharge status: Home / Self Care | Source: Ambulatory Visit | Attending: Oncology | Admitting: Oncology

## 2023-04-06 ENCOUNTER — Other Ambulatory Visit: Payer: Self-pay | Admitting: Oncology

## 2023-04-07 ENCOUNTER — Inpatient Hospital Stay

## 2023-04-07 ENCOUNTER — Inpatient Hospital Stay: Admitting: Oncology

## 2023-04-14 ENCOUNTER — Other Ambulatory Visit: Payer: Self-pay | Admitting: Oncology

## 2023-04-28 ENCOUNTER — Other Ambulatory Visit

## 2023-04-28 ENCOUNTER — Ambulatory Visit: Admitting: Oncology

## 2023-04-28 ENCOUNTER — Ambulatory Visit

## 2023-05-09 ENCOUNTER — Encounter: Payer: Self-pay | Admitting: Internal Medicine

## 2023-05-09 ENCOUNTER — Emergency Department

## 2023-05-09 ENCOUNTER — Other Ambulatory Visit: Payer: Self-pay

## 2023-05-09 ENCOUNTER — Observation Stay
Admission: EM | Admit: 2023-05-09 | Discharge: 2023-05-11 | Disposition: A | Discharge status: Home / Self Care | Attending: Internal Medicine | Admitting: Internal Medicine

## 2023-05-09 DIAGNOSIS — R4182 Altered mental status, unspecified: Secondary | ICD-10-CM | POA: Insufficient documentation

## 2023-05-09 DIAGNOSIS — E119 Type 2 diabetes mellitus without complications: Secondary | ICD-10-CM | POA: Diagnosis not present

## 2023-05-09 DIAGNOSIS — E039 Hypothyroidism, unspecified: Secondary | ICD-10-CM | POA: Diagnosis not present

## 2023-05-09 DIAGNOSIS — Z7982 Long term (current) use of aspirin: Secondary | ICD-10-CM | POA: Insufficient documentation

## 2023-05-09 DIAGNOSIS — R2681 Unsteadiness on feet: Secondary | ICD-10-CM | POA: Diagnosis not present

## 2023-05-09 DIAGNOSIS — I10 Essential (primary) hypertension: Secondary | ICD-10-CM | POA: Insufficient documentation

## 2023-05-09 DIAGNOSIS — E785 Hyperlipidemia, unspecified: Secondary | ICD-10-CM | POA: Diagnosis present

## 2023-05-09 DIAGNOSIS — I700 Atherosclerosis of aorta: Secondary | ICD-10-CM | POA: Insufficient documentation

## 2023-05-09 DIAGNOSIS — R41841 Cognitive communication deficit: Secondary | ICD-10-CM | POA: Diagnosis not present

## 2023-05-09 DIAGNOSIS — R0602 Shortness of breath: Secondary | ICD-10-CM | POA: Insufficient documentation

## 2023-05-09 DIAGNOSIS — I6621 Occlusion and stenosis of right posterior cerebral artery: Secondary | ICD-10-CM | POA: Diagnosis not present

## 2023-05-09 DIAGNOSIS — Z87891 Personal history of nicotine dependence: Secondary | ICD-10-CM | POA: Insufficient documentation

## 2023-05-09 DIAGNOSIS — I639 Cerebral infarction, unspecified: Principal | ICD-10-CM | POA: Diagnosis present

## 2023-05-09 DIAGNOSIS — Z7984 Long term (current) use of oral hypoglycemic drugs: Secondary | ICD-10-CM | POA: Diagnosis not present

## 2023-05-09 DIAGNOSIS — D500 Iron deficiency anemia secondary to blood loss (chronic): Secondary | ICD-10-CM | POA: Diagnosis not present

## 2023-05-09 DIAGNOSIS — R202 Paresthesia of skin: Secondary | ICD-10-CM | POA: Diagnosis present

## 2023-05-09 DIAGNOSIS — I6389 Other cerebral infarction: Secondary | ICD-10-CM | POA: Diagnosis not present

## 2023-05-09 DIAGNOSIS — Z86018 Personal history of other benign neoplasm: Secondary | ICD-10-CM | POA: Diagnosis not present

## 2023-05-09 DIAGNOSIS — E1165 Type 2 diabetes mellitus with hyperglycemia: Secondary | ICD-10-CM | POA: Insufficient documentation

## 2023-05-09 DIAGNOSIS — Z7902 Long term (current) use of antithrombotics/antiplatelets: Secondary | ICD-10-CM | POA: Insufficient documentation

## 2023-05-09 DIAGNOSIS — Z23 Encounter for immunization: Secondary | ICD-10-CM | POA: Diagnosis not present

## 2023-05-09 DIAGNOSIS — F4323 Adjustment disorder with mixed anxiety and depressed mood: Secondary | ICD-10-CM | POA: Diagnosis not present

## 2023-05-09 DIAGNOSIS — G4733 Obstructive sleep apnea (adult) (pediatric): Secondary | ICD-10-CM

## 2023-05-09 DIAGNOSIS — Z8505 Personal history of malignant neoplasm of liver: Secondary | ICD-10-CM | POA: Insufficient documentation

## 2023-05-09 DIAGNOSIS — C220 Liver cell carcinoma: Secondary | ICD-10-CM | POA: Diagnosis present

## 2023-05-09 DIAGNOSIS — D509 Iron deficiency anemia, unspecified: Secondary | ICD-10-CM | POA: Diagnosis present

## 2023-05-09 HISTORY — DX: Benign neoplasm of pituitary gland: D35.2

## 2023-05-09 HISTORY — DX: Type 2 diabetes mellitus without complications: E11.9

## 2023-05-09 HISTORY — DX: Liver cell carcinoma: C22.0

## 2023-05-09 HISTORY — DX: Iron deficiency anemia, unspecified: D50.9

## 2023-05-09 LAB — PROTIME-INR
INR: 1.1 (ref 0.8–1.2)
Prothrombin Time: 14.1 s (ref 11.4–15.2)

## 2023-05-09 LAB — CBC
HCT: 38.2 % — ABNORMAL LOW (ref 39.0–52.0)
Hemoglobin: 11.8 g/dL — ABNORMAL LOW (ref 13.0–17.0)
MCH: 22.3 pg — ABNORMAL LOW (ref 26.0–34.0)
MCHC: 30.9 g/dL (ref 30.0–36.0)
MCV: 72.1 fL — ABNORMAL LOW (ref 80.0–100.0)
Platelets: 323 10*3/uL (ref 150–400)
RBC: 5.3 MIL/uL (ref 4.22–5.81)
RDW: 15.1 % (ref 11.5–15.5)
WBC: 7.3 10*3/uL (ref 4.0–10.5)
nRBC: 0 % (ref 0.0–0.2)

## 2023-05-09 LAB — COMPREHENSIVE METABOLIC PANEL WITH GFR
ALT: 18 U/L (ref 0–44)
AST: 22 U/L (ref 15–41)
Albumin: 3.7 g/dL (ref 3.5–5.0)
Alkaline Phosphatase: 41 U/L (ref 38–126)
Anion gap: 8 (ref 5–15)
BUN: 20 mg/dL (ref 8–23)
CO2: 23 mmol/L (ref 22–32)
Calcium: 9.1 mg/dL (ref 8.9–10.3)
Chloride: 104 mmol/L (ref 98–111)
Creatinine, Ser: 1.14 mg/dL (ref 0.61–1.24)
GFR, Estimated: >60 mL/min (ref >60)
Glucose, Bld: 155 mg/dL — ABNORMAL HIGH (ref 70–99)
Potassium: 4 mmol/L (ref 3.5–5.1)
Sodium: 135 mmol/L (ref 135–145)
Total Bilirubin: 0.5 mg/dL (ref 0.0–1.2)
Total Protein: 7.3 g/dL (ref 6.5–8.1)

## 2023-05-09 LAB — GLUCOSE, CAPILLARY: Glucose-Capillary: 150 mg/dL — ABNORMAL HIGH (ref 70–99)

## 2023-05-09 LAB — DIFFERENTIAL
Abs Immature Granulocytes: 0.01 10*3/uL (ref 0.00–0.07)
Basophils Absolute: 0 10*3/uL (ref 0.0–0.1)
Basophils Relative: 0 %
Eosinophils Absolute: 0.4 10*3/uL (ref 0.0–0.5)
Eosinophils Relative: 5 %
Immature Granulocytes: 0 %
Lymphocytes Relative: 31 %
Lymphs Abs: 2.3 10*3/uL (ref 0.7–4.0)
Monocytes Absolute: 0.7 10*3/uL (ref 0.1–1.0)
Monocytes Relative: 10 %
Neutro Abs: 3.9 10*3/uL (ref 1.7–7.7)
Neutrophils Relative %: 54 %

## 2023-05-09 LAB — CBG MONITORING, ED: Glucose-Capillary: 170 mg/dL — ABNORMAL HIGH (ref 70–99)

## 2023-05-09 LAB — HEMOGLOBIN A1C
Hgb A1c MFr Bld: 7.3 % — ABNORMAL HIGH (ref 4.8–5.6)
Mean Plasma Glucose: 162.81 mg/dL

## 2023-05-09 LAB — APTT: aPTT: 30 s (ref 24–36)

## 2023-05-09 LAB — ETHANOL: Alcohol, Ethyl (B): <15 mg/dL

## 2023-05-09 MED ORDER — FERROUS SULFATE 325 (65 FE) MG PO TABS
325.0000 mg | ORAL_TABLET | Freq: Every day | ORAL | Status: DC
  Administered 2023-05-10 – 2023-05-11 (×2): 325 mg via ORAL
  Filled 2023-05-09 (×2): qty 1

## 2023-05-09 MED ORDER — MIRTAZAPINE 15 MG PO TABS
7.5000 mg | ORAL_TABLET | Freq: Every day | ORAL | Status: DC
  Administered 2023-05-10: 7.5 mg via ORAL
  Filled 2023-05-09 (×2): qty 1

## 2023-05-09 MED ORDER — ONDANSETRON HCL 4 MG/2ML IJ SOLN: 4.0000 mg | Freq: Three times a day (TID) | INTRAMUSCULAR | Status: DC | PRN

## 2023-05-09 MED ORDER — ACETAMINOPHEN 650 MG RE SUPP: 650.0000 mg | RECTAL | Status: DC | PRN

## 2023-05-09 MED ORDER — LATANOPROST 0.005 % OP SOLN
1.0000 [drp] | Freq: Every day | OPHTHALMIC | Status: DC
  Administered 2023-05-10: 1 [drp] via OPHTHALMIC
  Filled 2023-05-09: qty 2.5

## 2023-05-09 MED ORDER — ACETAMINOPHEN 160 MG/5ML PO SOLN: 650.0000 mg | ORAL | Status: DC | PRN

## 2023-05-09 MED ORDER — ENOXAPARIN SODIUM 60 MG/0.6ML IJ SOSY
45.0000 mg | PREFILLED_SYRINGE | INTRAMUSCULAR | Status: DC
  Administered 2023-05-10 – 2023-05-11 (×2): 45 mg via SUBCUTANEOUS
  Filled 2023-05-09 (×2): qty 0.6

## 2023-05-09 MED ORDER — STROKE: EARLY STAGES OF RECOVERY BOOK: Freq: Once | Status: AC

## 2023-05-09 MED ORDER — ASPIRIN 81 MG PO CHEW
324.0000 mg | CHEWABLE_TABLET | Freq: Once | ORAL | Status: AC
  Administered 2023-05-09: 324 mg via ORAL
  Filled 2023-05-09: qty 4

## 2023-05-09 MED ORDER — ASPIRIN 325 MG PO TBEC
325.0000 mg | DELAYED_RELEASE_TABLET | Freq: Every day | ORAL | Status: DC
  Administered 2023-05-10: 325 mg via ORAL
  Filled 2023-05-09: qty 1

## 2023-05-09 MED ORDER — POLYMYXIN B-TRIMETHOPRIM 10000-0.1 UNIT/ML-% OP SOLN: 1.0000 [drp] | OPHTHALMIC | Status: DC

## 2023-05-09 MED ORDER — INSULIN ASPART 100 UNIT/ML IJ SOLN: 0.0000 [IU] | Freq: Every day | INTRAMUSCULAR | Status: DC

## 2023-05-09 MED ORDER — PNEUMOCOCCAL 20-VAL CONJ VACC 0.5 ML IM SUSY
0.5000 mL | PREFILLED_SYRINGE | INTRAMUSCULAR | Status: AC
  Administered 2023-05-11: 0.5 mL via INTRAMUSCULAR
  Filled 2023-05-09: qty 0.5

## 2023-05-09 MED ORDER — OMEGA-3-ACID ETHYL ESTERS 1 G PO CAPS
1.0000 | ORAL_CAPSULE | Freq: Every day | ORAL | Status: DC
Start: 2023-05-10 — End: 2023-05-11
  Administered 2023-05-10 – 2023-05-11 (×2): 1 g via ORAL
  Filled 2023-05-09 (×2): qty 1

## 2023-05-09 MED ORDER — HYDRALAZINE HCL 20 MG/ML IJ SOLN: 5.0000 mg | INTRAMUSCULAR | Status: DC | PRN

## 2023-05-09 MED ORDER — TRAZODONE HCL 50 MG PO TABS: 50.0000 mg | ORAL_TABLET | Freq: Every evening | ORAL | Status: DC | PRN

## 2023-05-09 MED ORDER — PREDNISOLONE ACETATE 1 % OP SUSP: 1.0000 [drp] | OPHTHALMIC | Status: DC

## 2023-05-09 MED ORDER — ACETAMINOPHEN 325 MG PO TABS: 650.0000 mg | ORAL_TABLET | ORAL | Status: DC | PRN

## 2023-05-09 MED ORDER — LEVOTHYROXINE SODIUM 50 MCG PO TABS
75.0000 ug | ORAL_TABLET | Freq: Every day | ORAL | Status: DC
  Administered 2023-05-10 – 2023-05-11 (×2): 75 ug via ORAL
  Filled 2023-05-09 (×2): qty 1

## 2023-05-09 MED ORDER — INSULIN ASPART 100 UNIT/ML IJ SOLN
0.0000 [IU] | Freq: Three times a day (TID) | INTRAMUSCULAR | Status: DC
  Administered 2023-05-10: 2 [IU] via SUBCUTANEOUS
  Filled 2023-05-09: qty 1

## 2023-05-09 MED ORDER — ATORVASTATIN CALCIUM 20 MG PO TABS
40.0000 mg | ORAL_TABLET | Freq: Every day | ORAL | Status: DC
  Administered 2023-05-10 – 2023-05-11 (×2): 40 mg via ORAL
  Filled 2023-05-09 (×2): qty 2

## 2023-05-09 MED ORDER — SENNOSIDES-DOCUSATE SODIUM 8.6-50 MG PO TABS: 1.0000 | ORAL_TABLET | Freq: Every evening | ORAL | Status: DC | PRN

## 2023-05-09 NOTE — ED Notes (Signed)
 Report off to shawn f paramedic

## 2023-05-09 NOTE — ED Triage Notes (Signed)
 Pt to ED via POV from home. Pt reports woke up yesterday morning at 9am with right arm numbness, right mouth numbness and right leg. Pt also reports blurry vision. LWK 4/26 at 9pm when pt went to bed. No hx of stroke. Pt is currently on injections for stage 4 lung cancer.

## 2023-05-09 NOTE — H&P (Signed)
 History and Physical    Aaron Lowery ZOX:[id number] DOB: September 17, 1950 DOA: 05/09/2023  Referring MD/NP/PA:   PCP: [facility]   Patient coming from:  The patient is coming from home.     Chief Complaint: Right-sided numbness, blurry vision  HPI: Aaron Lowery is a 73 y.o. male with medical history significant of HTN, HLD, DM, hypothyroidism, depression with anxiety, anemia, known pituitary adenoma, hepatocellular carcinoma on immunotherapy currently, who presents with right-sided numbness and blurry vision.  Pt was LKN at 9:00 PM of 4/26. He states that he woke up yesterday (4/27) morning at 9am with right-sided numbness, including numbness in mouth, right arm and right leg. He also has blurry vision, no weakness.  No hearing loss.  Patient has mild SOB, no chest pain, cough.  No fever or chills. He states that he was recently diagnosed with diabetes mellitus, and started taking metformin which causes mild diarrhea. Patient states that he stopped taking pravastatin due to muscle pain.  He said that he would like to try different statin.   Data reviewed independently and ED Course: pt was found to have WBC 7.3, GFR > 60, INR 1.1, PTT 30, temperature normal, blood pressure 125/96, heart rate 85, RR 20, oxygen saturation 100% on room air.  CT of head negative.  MRI for brain showed acute stroke.  Patient is placed in telemetry bed for observation.  MRI of the brain: 1. Scattered posterior circulation infarcts involving the left thalamus and right PCA distribution as above. No associated hemorrhage or mass effect. 2. Underlying mild to moderate chronic microvascular ischemic disease with remote right thalamic lacunar infarct. 3. 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 Correlation with pituitary function tests recommended. Additionally, follow-up examination with nonemergent pituitary protocol mass MRI suggested for further evaluatio   EKG:  I have personally reviewed.  Sinus rhythm, QTc 426, LAD, poor R wave progression, mild T wave inversion in lead II-V4, low voltage.   Review of Systems:   General: no fevers, chills, no body weight gain,  has fatigue HEENT: no blurry vision, hearing changes or sore throat Respiratory: has dyspnea, no coughing, wheezing CV: no chest pain, no palpitations GI: no nausea, vomiting, abdominal pain, has diarrhea, no constipation GU: no dysuria, burning on urination, increased urinary frequency, hematuria  Ext: no leg edema Neuro: Has right-sided numbness and blurry vision Skin: no rash, no skin tear. MSK: No muscle spasm, no deformity, no limitation of range of movement in spin Heme: No easy bruising.  Travel history: No recent long distant travel.   Allergy:  Allergies  Allergen Reactions   Pravastatin     Muscle pain     Past Medical History:  Diagnosis Date   Diabetes mellitus without complication (HCC)    Hepatocellular carcinoma (HCC)    Hypercholesteremia    Hypertension    Iron deficiency anemia    Pituitary adenoma (HCC)     Past Surgical History:  Procedure Laterality Date   Brain tumor surgery      Social History:  reports that he has quit smoking. His smoking use included cigarettes. He has never used smokeless tobacco. He reports current alcohol use of about 3.0 standard drinks of alcohol per week. He reports that he does not use drugs.  Family History:  Family History  Problem Relation Age of Onset   Diabetes Mother    Diabetes Father    Diabetes Sister      Prior to Admission medications  Medication Sig Start Date End Date Taking? Authorizing Provider  furosemide (LASIX) 20 MG tablet Take by mouth. 02/23/23 02/23/24  [provider]  hydrochlorothiazide (HYDRODIURIL) 12.5 MG tablet Take by mouth. 02/18/23 02/18/24  [provider]  ibuprofen (ADVIL) 800 MG tablet Take 800 mg by mouth every 6 (six) hours as needed. Patient not taking:  Reported on 03/17/2023 10/22/22   [provider]  levocetirizine (XYZAL) 5 MG tablet Take 5 mg by mouth every evening. Patient not taking: Reported on 03/17/2023    [provider]  losartan (COZAAR) 50 MG tablet Take 50 mg by mouth 2 (two) times daily.    [provider]  mirtazapine  (REMERON ) 7.5 MG tablet Take 1 tablet (7.5 mg total) by mouth at bedtime. 03/14/23   Shellie Dials, MD  pravastatin (PRAVACHOL) 40 MG tablet Take 40 mg by mouth at bedtime. Patient not taking: Reported on 03/17/2023    [provider]  prednisoLONE acetate (PRED FORTE) 1 % ophthalmic suspension Place 1 drop into the right eye every 4 (four) hours. 07/26/22   [provider]  Travoprost, BAK Free, (TRAVATAN) 0.004 % SOLN ophthalmic solution Place 1 drop into both eyes at bedtime.    [provider]  traZODone (DESYREL) 100 MG tablet Take 100 mg by mouth at bedtime. 05/13/22   [provider]  trimethoprim-polymyxin b (POLYTRIM) ophthalmic solution Place 1 drop into the right eye every 4 (four) hours. 02/04/22   [provider]    Physical Exam: Vitals:   05/09/23 1715 05/09/23 2046 05/09/23 2138 05/09/23 2202  BP: (!) 125/96 (!) 148/102 (!) 148/95   Pulse: 80 79 78   Resp: 18 19    Temp: 98.3 F (36.8 C) 98.1 F (36.7 C) 98 F (36.7 C)   TempSrc: Oral Oral    SpO2: 100% 100% 94%   Weight:    90.2 kg   General: Not in acute distress HEENT:       Eyes: PERRL, EOMI, no jaundice       ENT: No discharge from the ears and nose, no pharynx injection, no tonsillar enlargement.        Neck: No JVD, no bruit, no mass felt. Heme: No neck lymph node enlargement. Cardiac: S1/S2, RRR, No murmurs, No gallops or rubs. Respiratory: No rales, wheezing, rhonchi or rubs. GI: Soft, nondistended, nontender, no rebound pain, no organomegaly, BS present. GU: No hematuria Ext: No pitting leg edema bilaterally. 1+DP/PT pulse bilaterally. Musculoskeletal: No  joint deformities, No joint redness or warmth, no limitation of ROM in spin. Skin: No rashes.  Neuro: Alert, oriented X3, cranial nerves II-XII grossly intact, moves all extremities normally. Muscle strength 5/5 in all extremities, sensation to light touch is slightly decreased in the right arm and leg.  Psych: Patient is not psychotic, no suicidal or hemocidal ideation.  Labs on Admission: I have personally reviewed following labs and imaging studies  CBC: Recent Labs  Lab 05/09/23 1257  WBC 7.3  NEUTROABS 3.9  HGB 11.8*  HCT 38.2*  MCV 72.1*  PLT 323   Basic Metabolic Panel: Recent Labs  Lab 05/09/23 1257  NA 135  K 4.0  CL 104  CO2 23  GLUCOSE 155*  BUN 20  CREATININE 1.14  CALCIUM 9.1   GFR: Estimated Creatinine Clearance: 61.7 mL/min (by C-G formula based on SCr of 1.14 mg/dL). Liver Function Tests: Recent Labs  Lab 05/09/23 1257  AST 22  ALT 18  ALKPHOS 41  BILITOT 0.5  PROT 7.3  ALBUMIN 3.7   No results for input(s): "LIPASE", "AMYLASE" in the last 168 hours. No results for input(s): "AMMONIA" in the last 168 hours. Coagulation Profile: Recent Labs  Lab 05/09/23 1257  INR 1.1   Cardiac Enzymes: No results for input(s): "CKTOTAL", "CKMB", "CKMBINDEX", "TROPONINI" in the last 168 hours. BNP (last 3 results) No results for input(s): "PROBNP" in the last 8760 hours. HbA1C: Recent Labs    05/09/23 1257  HGBA1C 7.3*   CBG: Recent Labs  Lab 05/09/23 1254 05/09/23 2310  GLUCAP 170* 150*   Lipid Profile: No results for input(s): "CHOL", "HDL", "LDLCALC", "TRIG", "CHOLHDL", "LDLDIRECT" in the last 72 hours. Thyroid  Function Tests: No results for input(s): "TSH", "T4TOTAL", "FREET4", "T3FREE", "THYROIDAB" in the last 72 hours. Anemia Panel: No results for input(s): "VITAMINB12", "FOLATE", "FERRITIN", "TIBC", "IRON", "RETICCTPCT" in the last 72 hours. Urine analysis:    Component Value Date/Time   COLORURINE YELLOW (A) 09/09/2021 1755    APPEARANCEUR CLEAR (A) 09/09/2021 1755   LABSPEC 1.021 09/09/2021 1755   PHURINE 5.0 09/09/2021 1755   GLUCOSEU NEGATIVE 09/09/2021 1755   HGBUR NEGATIVE 09/09/2021 1755   BILIRUBINUR NEGATIVE 09/09/2021 1755   KETONESUR NEGATIVE 09/09/2021 1755   PROTEINUR NEGATIVE 03/17/2023 0859   NITRITE NEGATIVE 09/09/2021 1755   LEUKOCYTESUR NEGATIVE 09/09/2021 1755   Sepsis Labs: @LABRCNTIP (procalcitonin:4,lacticidven:4) )No results found for this or any previous visit (from the past 240 hours).   Radiological Exams on Admission:   Assessment/Plan Principal Problem:   Stroke [facility]) Active Problems:   HTN (hypertension)   HLD (hyperlipidemia)   Diabetes mellitus without complication (HCC)   Iron deficiency anemia   Hepatocellular carcinoma (HCC)   Adjustment disorder with mixed anxiety and depressed mood   Assessment and Plan:  Stroke [facility]): MRI showed scattered posterior circulation infarcts involving the left thalamus and right PCA distribution as above. No associated hemorrhage or mass effect.  - Placed on tele bed for observation - CTA of  head and neck - will hold oral Bp meds to allow permissive HTN - ASA  - Statin: lipitor 40 mg daily - fasting lipid panel and HbA1c  - 2D transthoracic echocardiography  - swallowing screen. If fails, will get SLP - PT/OT consult  HTN (hypertension) -prn IV hydralazine for SBP>220 or dBP>110 -hold home Bp meds  HLD (hyperlipidemia) -Lipitor -Omega 3  Diabetes mellitus without complication (HCC): Recent A1c 7.9, poorly controlled.  Patient taking metformin -SSI  Iron deficiency anemia: Hemoglobin 11.8 (12.5 on 03/17/2023) -Continue iron supplement  Hepatocellular carcinoma (HCC): Stage IV HCC.  Patient is receiving immunotherapy at the [facility] every 2 weeks. -Following up with oncology in [place]  Adjustment disorder with mixed anxiety and depressed mood - Remeron        DVT ppx: sQ Lovenox  Code Status: Full code    Family  Communication:   Yes, patient's wife   at bed side.     Disposition Plan:  Anticipate discharge back to previous environment  Consults called:  none  Admission status and Level of care: Telemetry Medical:    for obs     Dispo: The patient is from: Home              Anticipated d/c is to: Home              Anticipated d/c date is: 1 day              Patient currently is not medically stable to d/c.  Severity of Illness:  The appropriate patient status for this patient is INPATIENT. Inpatient status is judged to be reasonable and necessary in order to provide the required intensity of service to ensure the patient's safety. The patient's presenting symptoms, physical exam findings, and initial radiographic and laboratory data in the context of their chronic comorbidities is felt to place them at high risk for further clinical deterioration. Furthermore, it is not anticipated that the patient will be medically stable for discharge from the hospital within 2 midnights of admission.   * I certify that at the point of admission it is my clinical judgment that the patient will require inpatient hospital care spanning beyond 2 midnights from the point of admission due to high intensity of service, high risk for further deterioration and high frequency of surveillance required.*       Date of Service 05/10/2023    Aaron Lowery [facility]   If 7PM-7AM, please contact night-coverage [email or web address] 05/10/2023, 1:33 AM

## 2023-05-09 NOTE — Progress Notes (Signed)
 PHARMACIST - PHYSICIAN COMMUNICATION  CONCERNING:  Enoxaparin (Lovenox) for DVT Prophylaxis    RECOMMENDATION: Patient was prescribed enoxaprin 40mg  q24 hours for VTE prophylaxis.   Filed Weights   05/09/23 2202  Weight: 90.2 kg (198 lb 13.7 oz)    Body mass index is 31.24 kg/m.  Estimated Creatinine Clearance: 61.7 mL/min (by C-G formula based on SCr of 1.14 mg/dL).   Based on [facility] policy patient is candidate for enoxaparin 0.5mg /kg TBW SQ every 24 hours based on BMI being >30.  DESCRIPTION: Pharmacy has adjusted enoxaparin dose per [facility] policy.  Patient is now receiving enoxaparin 0.5 mg/kg every 24 hours   Coretta Dexter, PharmD, [facility] 05/09/2023 10:03 PM

## 2023-05-09 NOTE — ED Provider Notes (Signed)
 [facility] Provider Note    Event Date/Time   First MD Initiated Contact with Patient 05/09/23 1554     (approximate)   History   Numbness   HPI  Aaron Lowery is a 73 y.o. male who presents to the ED for evaluation of Numbness   Reviewed PCP visit from 1 week ago.  History of DM and recent diagnosis of HCC, currently taking Atezolizumab  and Bevacizumab , local nodal metastases suspected on recent pet imaging.  Known pituitary adenoma..  Hypothyroidism.  Patient presents alongside his wife for evaluation about 36 hours of right sided numbness, dysmetria to the right hand and vision changes that been persistent since he awoke the morning of 4/27.  No falls, syncope, fevers.  No headache.   Physical Exam   Triage Vital Signs: ED Triage Vitals  Encounter Vitals Group     BP 05/09/23 1248 (!) 119/91     Systolic BP Percentile --      Diastolic BP Percentile --      Pulse Rate 05/09/23 1248 85     Resp 05/09/23 1248 20     Temp 05/09/23 1248 98.4 F (36.9 C)     Temp Source 05/09/23 1248 Oral     SpO2 05/09/23 1248 98 %     Weight --      Height --      Head Circumference --      Peak Flow --      Pain Score 05/09/23 1249 0     Pain Loc --      Pain Education --      Exclude from Growth Chart --     Most recent vital signs: Vitals:   05/09/23 2046 05/09/23 2138  BP: (!) 148/102 (!) 148/95  Pulse: 79 78  Resp: 19   Temp: 98.1 F (36.7 C) 98 F (36.7 C)  SpO2: 100% 94%    General: Awake, no distress.  CV:  Good peripheral perfusion.  Resp:  Normal effort.  Abd:  No distention.  MSK:  No deformity noted.  Neuro:  Sensory asymmetry with decreased sensation to light touch on the right side.  Hand grip weakness on the right. Other:     ED Results / Procedures / Treatments   Labs (all labs ordered are listed, but only abnormal results are displayed) Labs Reviewed  CBC - Abnormal; Notable for the following components:      Result  Value   Hemoglobin 11.8 (*)    HCT 38.2 (*)    MCV 72.1 (*)    MCH 22.3 (*)    All other components within normal limits  COMPREHENSIVE METABOLIC PANEL WITH GFR - Abnormal; Notable for the following components:   Glucose, Bld 155 (*)    All other components within normal limits  CBG MONITORING, ED - Abnormal; Notable for the following components:   Glucose-Capillary 170 (*)    All other components within normal limits  PROTIME-INR  APTT  DIFFERENTIAL  ETHANOL    EKG Sinus rhythm with a rate of 86 bpm.  Leftward axis.  Normal intervals.  No percents of acute ischemia.  Nonspecific changes  RADIOLOGY CT head interpreted by me without evidence of acute intracranial pathology  Official radiology report(s): MR BRAIN WO CONTRAST Result Date: 05/09/2023 CLINICAL DATA:  Initial evaluation for acute right-sided numbness, visual disturbance. EXAM: MRI HEAD WITHOUT CONTRAST TECHNIQUE: Multiplanar, multiecho pulse sequences of the brain and surrounding structures were obtained without intravenous contrast. COMPARISON:  Of CT from earlier the same day. FINDINGS: Brain: Cerebral volume within normal limits. Patchy T2/FLAIR hyperintensity involving the periventricular deep white matter both cerebral hemispheres, consistent with chronic small vessel ischemic disease, mild-to-moderate in nature. Remote lacunar infarct present at the right thalamus. 7 mm acute ischemic nonhemorrhagic left thalamic lacunar infarct (series 5, image 25). Additional small volume patchy restricted diffusion involving the mesial right temporal and right occipital lobes, consistent with patchy small volume acute right PCA distribution infarcts (series 5, images 21, 19). No associated hemorrhage or mass effect. No other evidence for acute or subacute ischemia. Gray-white matter differentiation otherwise maintained. No acute intracranial hemorrhage. Few small chronic micro hemorrhages noted about the frontal lobes bilaterally, of  doubtful significance. Expansile mass measuring up to 2.5 cm seen involving the pituitary gland/sella (series 7, image 13). Inferior extension through the floor of the sella into the sphenoid sinus, with slight suprasellar extension. 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 No other mass lesion, mass effect or midline shift. No hydrocephalus or extra-axial fluid collection. Vascular: Major intracranial vascular flow voids are maintained. Skull and upper cervical spine: Craniocervical junction within normal limits. Bone marrow signal intensity normal. No scalp soft tissue abnormality. Prior craniotomy on the left. Sinuses/Orbits: Prior bilateral ocular lens replacement. Probable postobstructive super a shins noted within the right sphenoid sinus. Paranasal sinuses are otherwise clear. No significant mastoid effusion. Other: None. IMPRESSION: 1. Scattered posterior circulation infarcts involving the left thalamus and right PCA distribution as above. No associated hemorrhage or mass effect. 2. Underlying mild to moderate chronic microvascular ischemic disease with remote right thalamic lacunar infarct. 3. 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 Correlation with pituitary function tests recommended. Additionally, follow-up examination with nonemergent pituitary protocol mass MRI suggested for further evaluation. Electronically Signed   By: Virgia Griffins M.D.   On: 05/09/2023 19:50   CT HEAD WO CONTRAST Result Date: 05/09/2023 CLINICAL DATA:  Right-sided numbness and blurred vision beginning yesterday. EXAM: CT HEAD WITHOUT CONTRAST TECHNIQUE: Contiguous axial images were obtained from the base of the skull through the vertex without intravenous contrast. RADIATION DOSE REDUCTION: This exam was performed according to the departmental dose-optimization program which includes automated exposure control, adjustment of the mA and/or kV according to patient size and/or  use of iterative reconstruction technique. COMPARISON:  09/09/2021 FINDINGS: Brain: No evidence of intracranial hemorrhage, acute infarction, hydrocephalus, extra-axial collection, or intra-axial mass lesion. Mild diffuse cerebral atrophy and chronic small vessel disease again noted. A solid mass with bony expansion of the sella turcica is again noted measuring 1.7 x 1.7 cm, without significant change, consistent with a pituitary macroadenoma. Vascular:  No hyperdense vessel or other acute findings. Skull: No evidence of fracture or other significant bone abnormality. Old left frontal and parietal craniotomy defects again noted. Sinuses/Orbits:  No acute findings. Other: None. IMPRESSION: No acute intracranial abnormality. Stable mild cerebral atrophy and chronic small vessel disease. Stable intrasellar mass, consistent with pituitary macroadenoma. Electronically Signed   By: Marlyce Sine M.D.   On: 05/09/2023 15:43    PROCEDURES and INTERVENTIONS:  .Critical Care  Performed by: Arline Bennett, MD Authorized by: Arline Bennett, MD   Critical care provider statement:    Critical care time (minutes):  30   Critical care time was exclusive of:  Separately billable procedures and treating other patients   Critical care was necessary to treat or prevent imminent or life-threatening deterioration of the following conditions:  CNS failure or compromise  Critical care was time spent personally by me on the following activities:  Development of treatment plan with patient or surrogate, discussions with consultants, evaluation of patient's response to treatment, examination of patient, ordering and review of laboratory studies, ordering and review of radiographic studies, ordering and performing treatments and interventions, pulse oximetry, re-evaluation of patient's condition and review of old charts   Medications  ondansetron (ZOFRAN) injection 4 mg (has no administration in time range)  hydrALAZINE  (APRESOLINE) injection 5 mg (has no administration in time range)  mirtazapine  (REMERON ) tablet 7.5 mg (has no administration in time range)  traZODone (DESYREL) tablet 100 mg (has no administration in time range)  levothyroxine (SYNTHROID) tablet 75 mcg (has no administration in time range)  ferrous sulfate tablet 325 mg (has no administration in time range)  Omega 3 CAPS 1,000 mg (has no administration in time range)  prednisoLONE acetate (PRED FORTE) 1 % ophthalmic suspension 1 drop (has no administration in time range)  latanoprost (XALATAN) 0.005 % ophthalmic solution 1 drop (has no administration in time range)  trimethoprim-polymyxin b (POLYTRIM) ophthalmic solution 1 drop (has no administration in time range)  aspirin chewable tablet 324 mg (324 mg Oral Given 05/09/23 1624)     IMPRESSION / MDM / ASSESSMENT AND PLAN / ED COURSE  I reviewed the triage vital signs and the nursing notes.  Differential diagnosis includes, but is not limited to, ICH, CVA, hypokalemia  {Patient presents with symptoms of an acute illness or injury that is potentially life-threatening.  Patient presents with concerning symptoms for an ischemic CVA with symptoms for about 36 hours.  Reassuring vital signs.  Blood work with mild microcytic anemia.  Normal electrolytes.  CT head without ICH.  Will obtain MRI and provide full dose aspirin.  MRI no signs of acute stroke.  I consult medicine for admission.  Outside the window for acute intervention.      FINAL CLINICAL IMPRESSION(S) / ED DIAGNOSES   Final diagnoses:  Cerebrovascular accident (CVA), unspecified mechanism (HCC)     Rx / DC Orders   ED Discharge Orders     None        Note:  This document was prepared using Dragon voice recognition software and may include unintentional dictation errors.   Arline Bennett, MD 05/09/23 [phone]

## 2023-05-09 NOTE — ED Notes (Signed)
 Aaron Collin, MD in room

## 2023-05-09 NOTE — ED Notes (Signed)
 Pt to mri

## 2023-05-10 ENCOUNTER — Observation Stay

## 2023-05-10 DIAGNOSIS — R29701 NIHSS score 1: Secondary | ICD-10-CM

## 2023-05-10 DIAGNOSIS — I6389 Other cerebral infarction: Principal | ICD-10-CM

## 2023-05-10 DIAGNOSIS — I639 Cerebral infarction, unspecified: Secondary | ICD-10-CM | POA: Diagnosis not present

## 2023-05-10 DIAGNOSIS — I63431 Cerebral infarction due to embolism of right posterior cerebral artery: Secondary | ICD-10-CM | POA: Diagnosis not present

## 2023-05-10 DIAGNOSIS — E785 Hyperlipidemia, unspecified: Secondary | ICD-10-CM | POA: Diagnosis not present

## 2023-05-10 LAB — URINALYSIS, COMPLETE (UACMP) WITH MICROSCOPIC
Bacteria, UA: NONE SEEN
Bilirubin Urine: NEGATIVE
Glucose, UA: NEGATIVE mg/dL
Hgb urine dipstick: NEGATIVE
Ketones, ur: NEGATIVE mg/dL
Leukocytes,Ua: NEGATIVE
Nitrite: NEGATIVE
Protein, ur: NEGATIVE mg/dL
Specific Gravity, Urine: 1.016 (ref 1.005–1.030)
Squamous Epithelial / HPF: 0 /HPF (ref 0–5)
pH: 5 (ref 5.0–8.0)

## 2023-05-10 LAB — LIPID PANEL
Cholesterol: 185 mg/dL (ref 0–200)
HDL: 49 mg/dL (ref >40)
LDL Cholesterol: 115 mg/dL — ABNORMAL HIGH (ref 0–99)
Total CHOL/HDL Ratio: 3.8 ratio
Triglycerides: 103 mg/dL (ref <150)
VLDL: 21 mg/dL (ref 0–40)

## 2023-05-10 LAB — URINE DRUG SCREEN, QUALITATIVE (ARMC ONLY)
Amphetamines, Ur Screen: NOT DETECTED
Barbiturates, Ur Screen: NOT DETECTED
Benzodiazepine, Ur Scrn: NOT DETECTED
Cannabinoid 50 Ng, Ur ~~LOC~~: NOT DETECTED
Cocaine Metabolite,Ur ~~LOC~~: NOT DETECTED
MDMA (Ecstasy)Ur Screen: NOT DETECTED
Methadone Scn, Ur: NOT DETECTED
Opiate, Ur Screen: NOT DETECTED
Phencyclidine (PCP) Ur S: NOT DETECTED
Tricyclic, Ur Screen: NOT DETECTED

## 2023-05-10 LAB — GLUCOSE, CAPILLARY
Glucose-Capillary: 114 mg/dL — ABNORMAL HIGH (ref 70–99)
Glucose-Capillary: 151 mg/dL — ABNORMAL HIGH (ref 70–99)
Glucose-Capillary: 118 mg/dL — ABNORMAL HIGH (ref 70–99)
Glucose-Capillary: 128 mg/dL — ABNORMAL HIGH (ref 70–99)

## 2023-05-10 MED ORDER — CLOPIDOGREL BISULFATE 75 MG PO TABS
300.0000 mg | ORAL_TABLET | Freq: Once | ORAL | Status: AC
  Administered 2023-05-10: 300 mg via ORAL
  Filled 2023-05-10: qty 4

## 2023-05-10 MED ORDER — CLOPIDOGREL BISULFATE 75 MG PO TABS
75.0000 mg | ORAL_TABLET | Freq: Every day | ORAL | Status: DC
  Administered 2023-05-11: 75 mg via ORAL
  Filled 2023-05-10: qty 1

## 2023-05-10 MED ORDER — ASPIRIN 81 MG PO TBEC
81.0000 mg | DELAYED_RELEASE_TABLET | Freq: Every day | ORAL | Status: DC
  Administered 2023-05-11: 81 mg via ORAL
  Filled 2023-05-10: qty 1

## 2023-05-10 MED ORDER — IOHEXOL 350 MG/ML SOLN
75.0000 mL | Freq: Once | INTRAVENOUS | Status: AC | PRN
  Administered 2023-05-10: 75 mL via INTRAVENOUS

## 2023-05-10 NOTE — Progress Notes (Addendum)
 Progress Note    Aaron Lowery  WUJ:[id number] DOB: [birth year]  DOA: 05/09/2023 PCP: [facility]      Brief Narrative:    Medical records reviewed and are as summarized below:  Aaron Lowery is a 73 y.o. 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 Pt was LKN at 9:00 PM of 4/26. He states that he woke up on (4/27) morning at 9am with right-sided numbness, including numbness in mouth, right arm and right leg. He also had blurry vision.  He also complained of intermittent shortness of breath.  He was recently diagnosed with diabetes was started on metformin which sometimes causes mild diarrhea.  He stopped taking pravastatin because it caused muscle pain.  MRI of the brain: 1. Scattered posterior circulation infarcts involving the left thalamus and right PCA distribution as above. No associated hemorrhage or mass effect. 2. Underlying mild to moderate chronic microvascular ischemic disease with remote right thalamic lacunar infarct. 3. 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 Correlation with pituitary function tests recommended. Additionally, follow-up examination with nonemergent pituitary protocol mass MRI suggested for further evaluation  He was found to have acute stroke.   Assessment/Plan:   Principal Problem:   Stroke [facility]) Active Problems:   HTN (hypertension)   HLD (hyperlipidemia)   Diabetes mellitus without complication (HCC)   Iron deficiency anemia   Hepatocellular carcinoma (HCC)   Adjustment disorder with mixed anxiety and depressed mood    Body mass index is 31.24 kg/m.  (Obesity)   Acute PCA stroke: MRI showed scattered posterior circulation infarcts involving the left thalamus and right PCA  distribution. Right side numbness and blurry vision have improved. He did well with ST, PT and OT. Continue aspirin and Lipitor. Consulted neurologist, Dr. Cleone Dad, for further recommendations. 2D echo is pending   Mild intermittent shortness of breath: Obtain chest x-ray Oncologist notes from 04/27/2023 showed that patient has been complaining of chronic intermittent shortness of breath  Type II DM: NovoLog as needed for hyperglycemia.  Metformin on hold Hemoglobin A1c 7.3   Hyperlipidemia: He is willing to try Lipitor. He had been taking pravastatin in the past but it caused nausea so he stopped. 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   Last heart treatment (atezolizumab  and bevacizumab  infusion) on 04/27/2023 Outpatient follow-up with oncologist at [facility]. They thought he had ascites in the past but abdominal ultrasound did not show any ascites amenable for paracentesis.   Pituitary macroadenoma, 2.5 cm: He is under the care of an endocrinologist and taking obesity health system.    Comorbidities include hypothyroidism, glaucoma, pseudophakia, depression, anxiety, chronic anemia    Diet Order             Diet heart healthy/carb modified Fluid consistency: Thin  Diet effective now                            Consultants: Neurology  Procedures: None    Medications:    aspirin EC  325 mg Oral Daily   atorvastatin  40 mg Oral Daily   enoxaparin (LOVENOX) injection  45 mg Subcutaneous Q24H   ferrous sulfate  325 mg Oral Q breakfast   insulin aspart  0-5 Units Subcutaneous QHS   insulin aspart  0-9 Units Subcutaneous TID WC   latanoprost  1 drop Both Eyes QHS   levothyroxine  75 mcg Oral Q0600   mirtazapine   7.5 mg Oral QHS   omega-3 acid ethyl esters  1 capsule Oral Daily   pneumococcal 20-valent conjugate vaccine  0.5 mL Intramuscular Tomorrow-1000   Continuous Infusions:   Anti-infectives (From  admission, onward)    None              Family Communication/Anticipated D/C date and plan/Code Status   DVT prophylaxis:      Code Status: Full Code  Family Communication: Plan discussed with his wife at the bedside Disposition Plan: Plan to discharge home   Status is: Observation The patient will require care spanning > 2 midnights and should be moved to inpatient because: Acute stroke       Subjective:   Interval events noted.  He has no complaints.  He feels better.  Right-sided numbness has improved.  His wife was at the bedside.  Objective:    Vitals:   05/09/23 2202 05/10/23 0138 05/10/23 0536 05/10/23 0938  BP:  124/83 126/88 (!) 146/88  Pulse:  72 78 81  Resp:    18  Temp:  98.3 F (36.8 C) (!) 97.3 F (36.3 C) 98.3 F (36.8 C)  TempSrc:  Oral  Oral  SpO2:  96% 99% 96%  Weight: 90.2 kg      No data found.   Intake/Output Summary (Last 24 hours) at 05/10/2023 1304 Last data filed at 05/10/2023 0900 Gross per 24 hour  Intake 240 ml  Output --  Net 240 ml   Filed Weights   05/09/23 2202  Weight: 90.2 kg    Exam:  GEN: NAD SKIN: Warm and dry EYES: No pallor or icterus.  PERRLA, EOMI ENT: MMM CV: RRR PULM: CTA B ABD: soft, protuberant, NT, +BS CNS: AAO x 3, non focal EXT: No edema or tenderness        Data Reviewed:   I have personally reviewed following labs and imaging studies:  Labs: Labs show the following:   Basic Metabolic Panel: Recent Labs  Lab 05/09/23 1257  NA 135  K 4.0  CL 104  CO2 23  GLUCOSE 155*  BUN 20  CREATININE 1.14  CALCIUM 9.1   GFR Estimated Creatinine Clearance: 61.7 mL/min (by C-G formula based on SCr of 1.14 mg/dL). Liver Function Tests: Recent Labs  Lab 05/09/23 1257  AST 22  ALT 18  ALKPHOS 41  BILITOT 0.5  PROT 7.3  ALBUMIN 3.7   No results for input(s): "LIPASE", "AMYLASE" in the last 168 hours. No results for input(s): "AMMONIA" in the last 168 hours. Coagulation  profile Recent Labs  Lab 05/09/23 1257  INR 1.1    CBC: Recent Labs  Lab 05/09/23 1257  WBC 7.3  NEUTROABS 3.9  HGB 11.8*  HCT 38.2*  MCV 72.1*  PLT 323   Cardiac Enzymes: No results for input(s): "CKTOTAL", "CKMB", "CKMBINDEX", "TROPONINI" in the last 168 hours. BNP (last 3 results) No results for input(s): "PROBNP" in the last 8760 hours. CBG: Recent Labs  Lab 05/09/23 1254 05/09/23 2310 05/10/23 0804 05/10/23 1141  GLUCAP 170* 150* 114* 151*   D-Dimer: No results for input(s): "DDIMER" in the last 72 hours. Hgb A1c: Recent Labs    05/09/23 1257  HGBA1C 7.3*   Lipid Profile: Recent Labs    05/10/23 0511  CHOL 185  HDL 49  LDLCALC 115*  TRIG 103  CHOLHDL 3.8   Thyroid  function studies: No results for input(s): "TSH", "T4TOTAL", "T3FREE", "THYROIDAB" in the last 72 hours.  Invalid input(s): "FREET3" Anemia work up: No results for input(s): "VITAMINB12", "FOLATE", "FERRITIN", "TIBC", "IRON", "RETICCTPCT" in the last 72 hours. Sepsis Labs: Recent Labs  Lab 05/09/23 1257  WBC 7.3    Microbiology No results found for this or any previous visit (from the past 240 hours).  Procedures and diagnostic studies:  CT ANGIO HEAD NECK W WO CM Result Date: 05/10/2023 CLINICAL DATA:  73 year old male with small acute PCA territory infarcts on MRI yesterday. History of liver mass. EXAM: CT ANGIOGRAPHY HEAD AND NECK TECHNIQUE: Multidetector CT imaging of the head and neck was performed using the standard protocol during bolus administration of intravenous contrast. Multiplanar CT image reconstructions and MIPs were obtained to evaluate the vascular anatomy. Carotid stenosis measurements (when applicable) are obtained utilizing NASCET criteria, using the distal internal carotid diameter as the denominator. RADIATION DOSE REDUCTION: This exam was performed according to the departmental dose-optimization program which includes automated exposure control, adjustment of  the mA and/or kV according to patient size and/or use of iterative reconstruction technique. CONTRAST:  75mL OMNIPAQUE  IOHEXOL  350 MG/ML SOLN COMPARISON:  Brain MRI and head CT yesterday. FINDINGS: CTA NECK Skeleton: Widespread cervical spine disc and endplate degeneration. Bone mineralization is within normal limits. No acute or suspicious osseous lesion identified. Upper chest: Negative aside from mild atelectasis. Other neck: Punctate left parotid sialolithiasis. Otherwise within normal limits. Aortic arch: Calcified aortic atherosclerosis. 3 vessel arch. Tortuous distal arch and proximal descending aorta. Right carotid system: Tortuous brachiocephalic artery and right CCA origin with no significant plaque or stenosis. Tortuous right carotid bifurcation and mildly tortuous cervical right ICA with no significant plaque or stenosis. Left carotid system: Similar tortuosity of the left CCA without plaque or stenosis. Minimal plaque at the posterior left ICA origin, and left ICA tortuosity without stenosis. Vertebral arteries: Tortuous right subclavian artery origin. Normal right vertebral artery origin. Right vertebral artery is tortuous to the skull base with no plaque or stenosis. Proximal left subclavian artery mild soft and calcified plaque without stenosis. Normal left vertebral artery origin. Left vertebral is mildly dominant, tortuous throughout the neck and to the skull base with no significant plaque or stenosis. CTA HEAD Posterior circulation: Distal vertebral arteries and vertebrobasilar junction are patent without plaque or stenosis. Mildly dominant left V4. Both a ICAs appear dominant and patent. Patent basilar artery without stenosis. Patent SCA and left PCA origins. Fetal type right PCA origin. Left posterior communicating artery diminutive or absent. Short segment severe stenosis of the right PCA P2 on series 7, image 21 and series 11, image 14 with maintained distal enhancement. Contralateral  moderate left PCA proximal P2 irregularity and stenosis (also image 21 and series 11, image 20) and severe left PCA P3 stenosis (same images) but preserved distal enhancement. Anterior circulation: Both ICA siphons are patent with minimal siphon plaque and no stenosis. Normal right posterior communicating artery origin. Enlarged sella turcica again noted, see brain MRI yesterday. Patent carotid termini, MCA and ACA origins. Diminutive or absent anterior communicating artery. Bilateral ACA branches are within normal limits. Left MCA M1 segment and bifurcation are patent with mild tortuosity, no stenosis. Right MCA M1 segment and bifurcation similarly patent and tortuous without stenosis. Bilateral MCA branches are within normal limits. Venous sinuses: Early contrast timing, grossly patent. Anatomic variants: Mildly dominant left vertebral artery. Fetal type right PCA origin. Review of the MIP images  confirms the above findings IMPRESSION: 1. Negative for large vessel occlusion. No significant carotid or anterior circulation atherosclerosis or stenosis. 2. Positive for multifocal bilateral PCA stenoses, but no PCA branch occlusion is identified: - Severe Right P2 stenosis. - Moderate Left P2 and Severe Left P3 stenoses. 3. No Vertebrobasilar atherosclerosis or stenosis. Note also fetal type right PCA origin. 4. Pituitary region mass, as per MRI yesterday. 5. Mild left parotid gland sialolithiasis. Electronically Signed   By: Marlise Simpers M.D.   On: 05/10/2023 04:16   MR BRAIN WO CONTRAST Result Date: 05/09/2023 CLINICAL DATA:  Initial evaluation for acute right-sided numbness, visual disturbance. EXAM: MRI HEAD WITHOUT CONTRAST TECHNIQUE: Multiplanar, multiecho pulse sequences of the brain and surrounding structures were obtained without intravenous contrast. COMPARISON:  Of CT from earlier the same day. FINDINGS: Brain: Cerebral volume within normal limits. Patchy T2/FLAIR hyperintensity involving the periventricular  deep white matter both cerebral hemispheres, consistent with chronic small vessel ischemic disease, mild-to-moderate in nature. Remote lacunar infarct present at the right thalamus. 7 mm acute ischemic nonhemorrhagic left thalamic lacunar infarct (series 5, image 25). Additional small volume patchy restricted diffusion involving the mesial right temporal and right occipital lobes, consistent with patchy small volume acute right PCA distribution infarcts (series 5, images 21, 19). No associated hemorrhage or mass effect. No other evidence for acute or subacute ischemia. Gray-white matter differentiation otherwise maintained. No acute intracranial hemorrhage. Few small chronic micro hemorrhages noted about the frontal lobes bilaterally, of doubtful significance. Expansile mass measuring up to 2.5 cm seen involving the pituitary gland/sella (series 7, image 13). Inferior extension through the floor of the sella into the sphenoid sinus, with slight suprasellar extension. 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 No other mass lesion, mass effect or midline shift. No hydrocephalus or extra-axial fluid collection. Vascular: Major intracranial vascular flow voids are maintained. Skull and upper cervical spine: Craniocervical junction within normal limits. Bone marrow signal intensity normal. No scalp soft tissue abnormality. Prior craniotomy on the left. Sinuses/Orbits: Prior bilateral ocular lens replacement. Probable postobstructive super a shins noted within the right sphenoid sinus. Paranasal sinuses are otherwise clear. No significant mastoid effusion. Other: None. IMPRESSION: 1. Scattered posterior circulation infarcts involving the left thalamus and right PCA distribution as above. No associated hemorrhage or mass effect. 2. Underlying mild to moderate chronic microvascular ischemic disease with remote right thalamic lacunar infarct. 3. 2.5 cm mass involving the pituitary/sella, likely a pituitary macro  adenoma. Correlation with pituitary function tests recommended. Additionally, follow-up examination with nonemergent pituitary protocol mass MRI suggested for further evaluation. Electronically Signed   By: Virgia Griffins M.D.   On: 05/09/2023 19:50   CT HEAD WO CONTRAST Result Date: 05/09/2023 CLINICAL DATA:  Right-sided numbness and blurred vision beginning yesterday. EXAM: CT HEAD WITHOUT CONTRAST TECHNIQUE: Contiguous axial images were obtained from the base of the skull through the vertex without intravenous contrast. RADIATION DOSE REDUCTION: This exam was performed according to the departmental dose-optimization program which includes automated exposure control, adjustment of the mA and/or kV according to patient size and/or use of iterative reconstruction technique. COMPARISON:  09/09/2021 FINDINGS: Brain: No evidence of intracranial hemorrhage, acute infarction, hydrocephalus, extra-axial collection, or intra-axial mass lesion. Mild diffuse cerebral atrophy and chronic small vessel disease again noted. A solid mass with bony expansion of the sella turcica is again noted measuring 1.7 x 1.7 cm, without significant change, consistent with a pituitary macroadenoma. Vascular:  No hyperdense vessel or other acute findings. Skull: No evidence of  fracture or other significant bone abnormality. Old left frontal and parietal craniotomy defects again noted. Sinuses/Orbits:  No acute findings. Other: None. IMPRESSION: No acute intracranial abnormality. Stable mild cerebral atrophy and chronic small vessel disease. Stable intrasellar mass, consistent with pituitary macroadenoma. Electronically Signed   By: Marlyce Sine M.D.   On: 05/09/2023 15:43               LOS: 0 days   Hetal Proano  [facility]   Pager on [email or web address]. If 7PM-7AM, please contact night-coverage at [email or web address]     05/10/2023, 1:04 PM

## 2023-05-10 NOTE — Progress Notes (Signed)
 OT Cancellation Note  Patient Details Name: Jaamal Sandra MRN: [id number] DOB: [birth year]   Cancelled Treatment:    Reason Eval/Treat Not Completed: OT screened, no needs identified, will sign off. Per PT, pt appears at baseline function and reports no therapy needs. OT spoke briefly with pt regarding condition and he feels most symptoms have resolved with no issues with ADLs or vision at this time.   Beauregard Jarrells E Nao Linz 05/10/2023, 9:04 AM

## 2023-05-10 NOTE — TOC Initial Note (Signed)
 Transition of Care [facility]) - Initial/Assessment Note    Patient Details  Name: Aaron Lowery MRN: [id number] Date of Birth: [birth year]  [facility] CM/SW Contact:    Crayton Docker, RN Phone Number: 05/10/2023, 2:47 PM  Clinical Narrative:                  CM to patient's room regarding TOC screening assessment. CM introduced case management role and discharge care planning process. Patient verbalized understanding and agreement with TOC screening interview. Patient states lives with wife, Devra Fontana and dog--vaccinated. Per patient is independent ADLs.  Expected Discharge Plan: Home/Self Care Barriers to Discharge: Continued Medical Work up   Patient Goals and CMS Choice    Home/self care     Expected Discharge Plan and Services  Home/self care     Living arrangements for the past 2 months: Single Family Home                   Prior Living Arrangements/Services Living arrangements for the past 2 months: Single Family Home Lives with:: Spouse   Do you feel safe going back to the place where you live?: Yes      Need for Family Participation in Patient Care: Yes (Comment) Care giver support system in place?: Yes (comment)   Criminal Activity/Legal Involvement Pertinent to Current Situation/Hospitalization: No - Comment as needed  Activities of Daily Living   ADL Screening (condition at time of admission) Independently performs ADLs?: Yes (appropriate for developmental age) Is the patient deaf or have difficulty hearing?: No Does the patient have difficulty seeing, even when wearing glasses/contacts?: No Does the patient have difficulty concentrating, remembering, or making decisions?: No  Permission Sought/Granted Permission sought to share information with : Family Supports, Case Manager Permission granted to share information with : Yes, Verbal Permission Granted  Share Information with NAME: Sebashtian Zweig     Permission granted to share info w  Relationship: Wife  Permission granted to share info w Contact Information: [phone]  Emotional Assessment Appearance:: Appears stated age Attitude/Demeanor/Rapport: Engaged Affect (typically observed): Calm Orientation: : Oriented to Self, Oriented to Place, Oriented to  Time, Oriented to Situation   Psych Involvement: No (comment)  Admission diagnosis:  Stroke [facility]) [I63.9] Patient Active Problem List   Diagnosis Date Noted   Stroke (HCC) 05/09/2023   HLD (hyperlipidemia) 05/09/2023   HTN (hypertension) 05/09/2023   Diabetes mellitus without complication (HCC)    Iron deficiency anemia    Adjustment disorder with mixed anxiety and depressed mood 03/04/2023   Hepatocellular carcinoma (HCC) 11/18/2022   PCP:  [facility]:   [phone] - [place], [place] - [address] [place] [address] Phone: [phone] Fax: [phone]     Social Drivers of Health (SDOH) Social History: SDOH Screenings   Food Insecurity: No Food Insecurity (05/09/2023)  Housing: Unknown (05/09/2023)  Transportation Needs: No Transportation Needs (05/09/2023)  Utilities: Not At Risk (05/09/2023)  Depression (PHQ2-9): Low Risk  (10/25/2022)  Financial Resource Strain: Low Risk  (10/25/2022)  Physical Activity: Insufficiently Active (01/27/2021)   [facility], [facility]  Social Connections: Socially Integrated (05/09/2023)  Stress: Stress Concern Present (01/27/2021)   Received from [facility], [facility]  Tobacco Use: Medium Risk (05/09/2023)   SDOH Interventions:     Readmission Risk Interventions     No data to display

## 2023-05-10 NOTE — Plan of Care (Signed)

## 2023-05-10 NOTE — Consult Note (Signed)
 NEUROLOGY CONSULT NOTE   Date of service: May 10, 2023 Patient Name: Aaron Lowery MRN:  [id number] DOB:  1950-05-28 Chief Complaint: "right sided numbness" Requesting Provider: Sheril Dines, MD  History of Present Illness  Aaron Lowery is a 73 y.o. 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  Numbness has been improving and is nearly resolved at this time  He experienced body aches with previous cholesterol medication, leading him to discontinue it and switch to omega-3 supplements, which he feels have alleviated the aches. Despite this, his cholesterol remains inadequately controlled.  He is not having any body aches today despite having received atorvastatin this morning  He has sleep apnea but does not use a CPAP machine (reports he never tried one, seems as if he may have been lost to follow-up on treating this condition). He takes a sleeping pill, which he finds ineffective, and reports snoring and occasional breathing interruptions during sleep.  He has glaucoma and experiences visual disturbances, such as seeing shadows, which he attributes to his brain trying to fill in vision loss. This occurred about a week ago and lasted for two days, primarily affecting the edges of his vision, particularly on the left side.  He is receiving infusions for liver cancer, specifically mentioning Avastin  (bevacizumab ).  He quit smoking 25 years ago and does not currently smoke. He reports a  decreased sex drive and has tried Viagra, which he feels has not been effective.  LKW: 4/26 evening Modified rankin score: 0 IV Thrombolysis: No, out of the window EVT: No, no LVO   NIHSS components Score: Comment  1a Level of Conscious 0[x]  1[]  2[]  3[]      1b LOC Questions 0[x]  1[]  2[]       1c LOC Commands 0[x]  1[]  2[]       2 Best Gaze 0[x]  1[]  2[]       3 Visual 0[x]  1[]  2[]  3[]      4 Facial Palsy 0[x]  1[]  2[]  3[]      5a Motor Arm - left 0[x]  1[]  2[]  3[]  4[]  UN[]    5b Motor Arm - Right 0[x]  1[]  2[]  3[]  4[]  UN[]    6a Motor Leg - Left 0[x]  1[]  2[]  3[]  4[]  UN[]    6b Motor Leg - Right 0[x]  1[]  2[]  3[]  4[]  UN[]    7 Limb Ataxia 0[x]  1[]  2[]  UN[]      8 Sensory 0[]  1[x]  2[]  UN[]      9 Best Language 0[x]  1[]  2[]  3[]      10 Dysarthria 0[x]  1[]  2[]  UN[]      11 Extinct. and Inattention 0[x]  1[]  2[]       TOTAL:       ROS  Comprehensive ROS performed and pertinent positives documented in HPI    Past History   Past Medical History:  Diagnosis Date   Diabetes mellitus without complication (HCC)    Hepatocellular carcinoma (HCC)    Hypercholesteremia    Hypertension    Iron deficiency anemia    Pituitary adenoma (HCC)     Past Surgical History:  Procedure Laterality Date   Brain tumor surgery  Family History: Family History  Problem Relation Age of Onset   Diabetes Mother    Diabetes Father    Diabetes Sister     Social History  reports that he has quit smoking. His smoking use included cigarettes. He has never used smokeless tobacco. He reports current alcohol use of about 3.0 standard drinks of alcohol per week. He reports that he does not use drugs.  Allergies  Allergen Reactions   Pravastatin     Muscle pain     Medications   Current Facility-Administered Medications:    acetaminophen (TYLENOL) tablet 650 mg, 650 mg, Oral, Q4H PRN **OR** acetaminophen (TYLENOL) 160 MG/5ML solution 650 mg, 650 mg, Per Tube, Q4H PRN **OR** acetaminophen (TYLENOL) suppository  650 mg, 650 mg, Rectal, Q4H PRN, Niu, Xilin, MD   aspirin EC tablet 325 mg, 325 mg, Oral, Daily, Niu, Xilin, MD, 325 mg at 05/10/23 0824   atorvastatin (LIPITOR) tablet 40 mg, 40 mg, Oral, Daily, Niu, Xilin, MD, 40 mg at 05/10/23 0824   enoxaparin (LOVENOX) injection 45 mg, 45 mg, Subcutaneous, Q24H, Niu, Xilin, MD, 45 mg at 05/10/23 2831   ferrous sulfate tablet 325 mg, 325 mg, Oral, Q breakfast, Niu, Xilin, MD, 325 mg at 05/10/23 5176   hydrALAZINE (APRESOLINE) injection 5 mg, 5 mg, Intravenous, Q2H PRN, Niu, Xilin, MD   insulin aspart (novoLOG) injection 0-5 Units, 0-5 Units, Subcutaneous, QHS, Niu, Xilin, MD   insulin aspart (novoLOG) injection 0-9 Units, 0-9 Units, Subcutaneous, TID WC, Niu, Xilin, MD, 2 Units at 05/10/23 1225   latanoprost (XALATAN) 0.005 % ophthalmic solution 1 drop, 1 drop, Both Eyes, QHS, Niu, Xilin, MD   levothyroxine (SYNTHROID) tablet 75 mcg, 75 mcg, Oral, Q0600, Niu, Xilin, MD, 75 mcg at 05/10/23 1607   mirtazapine  (REMERON ) tablet 7.5 mg, 7.5 mg, Oral, QHS, Niu, Xilin, MD   omega-3 acid ethyl esters (LOVAZA) capsule 1 g, 1 capsule, Oral, Daily, Niu, Xilin, MD, 1 g at 05/10/23 0824   ondansetron (ZOFRAN) injection 4 mg, 4 mg, Intravenous, Q8H PRN, Niu, Xilin, MD   pneumococcal 20-valent conjugate vaccine (PREVNAR 20) injection 0.5 mL, 0.5 mL, Intramuscular, Tomorrow-1000, Niu, Xilin, MD   senna-docusate (Senokot-S) tablet 1 tablet, 1 tablet, Oral, QHS PRN, Niu, Xilin, MD   traZODone (DESYREL) tablet 50 mg, 50 mg, Oral, QHS PRN, Niu, Xilin, MD  Vitals   Vitals:   05/10/23 0138 05/10/23 0536 05/10/23 0938 05/10/23 1338  BP: 124/83 126/88 (!) 146/88 (!) 151/97  Pulse: 72 78 81 80  Resp:   18 19  Temp: 98.3 F (36.8 C) (!) 97.3 F (36.3 C) 98.3 F (36.8 C) 97.9 F (36.6 C)  TempSrc: Oral  Oral Oral  SpO2: 96% 99% 96% 97%  Weight:        Body mass index is 31.24 kg/m.  Physical Exam   Constitutional: Appears well-developed and well-nourished.  Psych:  Affect appropriate to situation, calm and cooperative, pleasant Eyes: No scleral injection HENT: No oropharyngeal obstruction.  MSK: no major joint deformities.  Cardiovascular: Normal rate and regular rhythm. Perfusing extremities well Respiratory: Effort normal, non-labored breathing GI: Soft.  No distension. There is no tenderness.  Skin: Warm dry and intact visible skin  Neurologic Examination   Mental Status: Patient is awake, alert, oriented to person, place, month, year, and situation. Patient is able to give a clear and coherent history. No signs of aphasia or neglect Cranial Nerves: II: Visual Fields are full to finger counting III,IV, VI: EOMI without ptosis or diploplia.  V: Facial sensation is symmetric to temperature VII: Facial movement is notable for slight right facial droop, baseline per wife therefore not scored on NIH VIII: hearing is intact to voice XII: tongue is midline without atrophy or fasciculations.  Motor: Tone is normal. Bulk is normal. 5/5 strength was present in all four extremities, other than mild bilateral hip flexion weakness 4/5 appropriate for age.  Sensory: Reports some reduced sensation only on the arm now and much improved from prior Deep Tendon Reflexes: 2+ and symmetric in the brachioradialis and patellae.  Cerebellar: FNF and HKS are intact bilaterally Gait:  Deferred   Labs/Imaging/Neurodiagnostic studies   CBC:  Recent Labs  Lab May 16, 2023 1257  WBC 7.3  NEUTROABS 3.9  HGB 11.8*  HCT 38.2*  MCV 72.1*  PLT 323   Basic Metabolic Panel:  Lab Results  Component Value Date   NA 135 05/16/2023   K 4.0 2023-05-16   CO2 23 2023-05-16   GLUCOSE 155 (H) 05-16-2023   BUN 20 2023-05-16   CREATININE 1.14 16-May-2023   CALCIUM 9.1 2023-05-16   GFRNONAA >60 May 16, 2023   Lipid Panel:  Lab Results  Component Value Date   CHOL 185 05/10/2023   HDL 49 05/10/2023   LDLCALC 115 (H) 05/10/2023   TRIG 103 05/10/2023   CHOLHDL 3.8  05/10/2023   HgbA1c:  Lab Results  Component Value Date   HGBA1C 7.3 (H) 2023/05/16   Alcohol Level     Component Value Date/Time   ETH <15 05-16-23 1257   INR  Lab Results  Component Value Date   INR 1.1 2023/05/16   APTT  Lab Results  Component Value Date   APTT 30 16-May-2023   AED levels: No results found for: "PHENYTOIN", "ZONISAMIDE", "LAMOTRIGINE", "LEVETIRACETA"  CT Head without contrast(Personally reviewed): No acute intracranial abnormality. Stable mild cerebral atrophy and chronic small vessel disease. Stable intrasellar mass, consistent with pituitary macroadenoma.  CT angio Head and Neck with contrast(Personally reviewed): 1. Negative for large vessel occlusion. No significant carotid or anterior circulation atherosclerosis or stenosis. 2. Positive for multifocal bilateral PCA stenoses, but no PCA branch occlusion is identified: - Severe Right P2 stenosis. - Moderate Left P2 and Severe Left P3 stenoses. 3. No Vertebrobasilar atherosclerosis or stenosis. Note also fetal type right PCA origin. 4. Pituitary region mass, as per MRI yesterday. 5. Mild left parotid gland sialolithiasis.  MRI Brain(Personally reviewed): 1. Scattered posterior circulation infarcts involving the left thalamus and right PCA distribution as above. No associated hemorrhage or mass effect. 2. Underlying mild to moderate chronic microvascular ischemic disease with remote right thalamic lacunar infarct. 3. 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 Correlation with pituitary function tests recommended. Additionally, follow-up examination with nonemergent pituitary protocol mass MRI suggested for further evaluation.    ASSESSMENT   Taraji Iannucci is a 73 y.o. male with multiple stroke risk factors as above including hypertension, hyperlipidemia, untreated sleep apnea, diabetes, hepatocellular carcinoma and use of bevacizumab , as well as remote  smoking, BMI 31.24,  With fetal right PCA, he has involvement of both the anterior and posterior circulation on his strokes in addition to involvement of both hemispheres.  This is highly concerning for central embolic source  Etiology may be cardioembolic, hypercoagulable state of malignancy is possible, also consider role of bevacizumab   RECOMMENDATIONS   # Left thalamic stroke and scattered right PCA embolic appearing strokes, etiology concerning for central embolic process - Patient is tolerating atorvastatin 40 mg well, recommend continuing  this medication, consider addition of coenzyme Q10 if he has recurrent myalgias - Echocardiogram pending - Lower extremity duplex to assess for hypercoagulable state of malignancy (DVTs) - Prophylactic therapy-Antiplatelet med: Aspirin - dose 325mg  PO or 300mg  PR, followed by 81 mg daily  - Plavix 300 mg load with 75 mg daily for 21 day course  - If an indication for anticoagulation is found would discontinue antiplatelet therapy from a stroke perspective - Will need to follow-up with oncology regarding risk/benefit of continuing bevacizumab  - Risk factor modification: Diet, exercise and weight loss counseling - Telemetry monitoring; 30 day event monitor on discharge if no arrythmias captured  - Blood pressure goal   - Normotension goal as he is out of the window for permissive hypertension - Would repeat sleep study outpatient and treat sleep apnea if found as this is another important modifiable risk factor for stroke - Neurology will follow along   ______________________________________________________________________  Baldwin Levee MD-PhD [facility] [phone] [facility] coverage for [facility] is from 8 AM to 4 AM in-house and 4 PM to 8 PM by telephone/video. 8 PM to 8 AM emergent questions or overnight urgent questions should be addressed to Teleneurology On-call or Arlin Benes neurohospitalist; contact information can  be found on AMION

## 2023-05-10 NOTE — Care Management Obs Status (Signed)
 MEDICARE OBSERVATION STATUS NOTIFICATION   Patient Details  Name: Aaron Lowery MRN: [id number] Date of Birth: October 18, 1950   Medicare Observation Status Notification Given:  Rudolph Cost, CMA 05/10/2023, 2:10 PM

## 2023-05-10 NOTE — Evaluation (Signed)
 Physical Therapy Evaluation and Discharge  Patient Details Name: Aaron Lowery MRN: [id number] DOB: 19-Nov-1950 Today's Date: 05/10/2023  History of Present Illness  Patient is a 73 year old male with right side numbness and blurry vision. MRI shows scattered posterior circulation infarcts involving the left thalamus and right PCA distribution HTN, HLD, DM, hypothyroidism, depression with anxiety, anemia, pituitary adenoma, hepatocellular carcinoma on immunotherapy.   Clinical Impression  Patient is agreeable to PT evaluation. He is independent at baseline and lives at home with his spouse in a home with no steps.  Today the patient reports feeling generally fatigued. He reports mild diminished sensation in the right heel and anterior tibia area that does not impact functional independence. The patient is Mod I with all mobility. He has a chronic leg length discrepancy where the right leg is shorter than the left leg and usually wears a small lift in the right shoe. No loss of balance with hallway ambulation without device. The patient is likely at or near his baseline level of functional independence. No apparent acute PT needs at this time. PT will sign off.       If plan is discharge home, recommend the following: Assist for transportation   Can travel by private vehicle        Equipment Recommendations None recommended by PT  Recommendations for Other Services       Functional Status Assessment Patient has not had a recent decline in their functional status     Precautions / Restrictions Precautions Precautions:  (low fall risk) Recall of Precautions/Restrictions: Intact Restrictions Weight Bearing Restrictions Per Provider Order: No      Mobility  Bed Mobility Overal bed mobility: Modified Independent                  Transfers Overall transfer level: Modified independent                      Ambulation/Gait Ambulation/Gait assistance: Modified  independent (Device/Increase time) Gait Distance (Feet): 175 Feet Assistive device: None Gait Pattern/deviations: Step-through pattern, Decreased stance time - right Gait velocity: decreased     General Gait Details: no loss of balance with hallway ambulation without device. patient usually wears lift in the right shoe for leg length discrepancy at baseline  Stairs            Wheelchair Mobility     Tilt Bed    Modified Rankin (Stroke Patients Only)       Balance Overall balance assessment: Needs assistance Sitting-balance support: Feet supported Sitting balance-Leahy Scale: Good Sitting balance - Comments: patient is able to reach forward outside base of support to donn socks with no loss of balance   Standing balance support: No upper extremity supported Standing balance-Leahy Scale: Fair                               Pertinent Vitals/Pain Pain Assessment Pain Assessment: No/denies pain    Home Living Family/patient expects to be discharged to:: Private residence Living Arrangements: Spouse/significant other Available Help at Discharge: Family Type of Home: House Home Access: Level entry       Home Layout: One level        Prior Function Prior Level of Function : Independent/Modified Independent                     Extremity/Trunk Assessment   Upper Extremity Assessment Upper  Extremity Assessment: Overall WFL for tasks assessed;Right hand dominant    Lower Extremity Assessment Lower Extremity Assessment: RLE deficits/detail RLE Deficits / Details: 5/5 knee extension, ankle dorsiflexion/plantarflexion. patient reports RLE is shorter than LLE and he wears shoe lifts at baseline RLE Sensation: decreased light touch (reported decreased sensation heel of foot and anterior tibia area)       Communication   Communication Communication: No apparent difficulties    Cognition Arousal: Alert Behavior During Therapy: WFL for tasks  assessed/performed   PT - Cognitive impairments: No apparent impairments                       PT - Cognition Comments: Patient reports feeling fatigue today Following commands: Intact       Cueing Cueing Techniques: Verbal cues     General Comments      Exercises     Assessment/Plan    PT Assessment Patient does not need any further PT services  PT Problem List         PT Treatment Interventions      PT Goals (Current goals can be found in the Care Plan section)  Acute Rehab PT Goals PT Goal Formulation: All assessment and education complete, DC therapy    Frequency       Co-evaluation               AM-PAC PT "6 Clicks" Mobility  Outcome Measure Help needed turning from your back to your side while in a flat bed without using bedrails?: None Help needed moving from lying on your back to sitting on the side of a flat bed without using bedrails?: None Help needed moving to and from a bed to a chair (including a wheelchair)?: None Help needed standing up from a chair using your arms (e.g., wheelchair or bedside chair)?: None Help needed to walk in hospital room?: None Help needed climbing 3-5 steps with a railing? : None 6 Click Score: 24    End of Session   Activity Tolerance: Patient tolerated treatment well Patient left: in bed;with call bell/phone within reach;with family/visitor present Nurse Communication: Mobility status PT Visit Diagnosis: Unsteadiness on feet (R26.81)    Time: 5409-8119 PT Time Calculation (min) (ACUTE ONLY): 17 min   Charges:   PT Evaluation $PT Eval Low Complexity: 1 Low   PT General Charges $$ ACUTE PT VISIT: 1 Visit         Ozie Bo, PT, MPT   Erlene Hawks 05/10/2023, 9:05 AM

## 2023-05-10 NOTE — Evaluation (Signed)
 Speech Language Pathology Evaluation Patient Details Name: Aaron Lowery MRN: [id number] DOB: 1950-10-12 Today's Date: 05/10/2023 Time: 5284-1324 SLP Time Calculation (min) (ACUTE ONLY): 10 min  Problem List:  Patient Active Problem List   Diagnosis Date Noted   Stroke (HCC) 05/09/2023   HLD (hyperlipidemia) 05/09/2023   HTN (hypertension) 05/09/2023   Diabetes mellitus without complication (HCC)    Iron deficiency anemia    Adjustment disorder with mixed anxiety and depressed mood 03/04/2023   Hepatocellular carcinoma (HCC) 11/18/2022   Past Medical History:  Past Medical History:  Diagnosis Date   Diabetes mellitus without complication (HCC)    Hepatocellular carcinoma (HCC)    Hypercholesteremia    Hypertension    Iron deficiency anemia    Pituitary adenoma (HCC)    Past Surgical History:  Past Surgical History:  Procedure Laterality Date   Brain tumor surgery     HPI:  Pt is a 73 y.o. male who presents with right-sided numbness and blurry vision with acute CVA of L thalamus and R PCA distribution found on MRI. CT angio head/neck: Severe Right P2, Moderate Left P2 and Severe Left P3 stenoses. PMH of HTN, HLD, DM, hypothyroidism, depression with anxiety, anemia, known pituitary adenoma, hepatocellular carcinoma on immunotherapy currently.   Assessment / Plan / Recommendation Clinical Impression  Pt seen for cognitive-communication evaluation. Assessment completed via informal means. Pt demonstated intact basic cognitive-communication. Pt and wife endorse pt is as baseline with exception of visual changes. See OT/PT notes for details. No f/u ST services warranted.    SLP Assessment  SLP Recommendation/Assessment: Patient does not need any further Speech Lanaguage Pathology Services SLP Visit Diagnosis: Cognitive communication deficit (R41.841)    Recommendations for follow up therapy are one component of a multi-disciplinary discharge planning process, led by the attending  physician.  Recommendations may be updated based on patient status, additional functional criteria and insurance authorization.    Follow Up Recommendations  No SLP follow up    Assistance Recommended at Discharge   (defer to OT/PT)  Functional Status Assessment Patient has not had a recent decline in their functional status        SLP Evaluation Cognition  Overall Cognitive Status: Within Functional Limits for tasks assessed Arousal/Alertness: Awake/alert Orientation Level: Oriented X4 Memory: Appears intact Awareness: Appears intact Problem Solving: Appears intact Safety/Judgment: Appears intact       Comprehension  Auditory Comprehension Overall Auditory Comprehension: Appears within functional limits for tasks assessed    Expression Expression Primary Mode of Expression: Verbal Verbal Expression Overall Verbal Expression: Appears within functional limits for tasks assessed   Oral / Motor  Oral Motor/Sensory Function Overall Oral Motor/Sensory Function: Within functional limits Motor Speech Overall Motor Speech: Appears within functional limits for tasks assessed Respiration: Within functional limits Phonation: Normal (mildly hoarse at times; baseline per pt) Resonance: Within functional limits Articulation: Within functional limitis Intelligibility: Intelligible Motor Planning: Witnin functional limits           Dia Forget, M.S., CCC-SLP Speech-Language Pathologist [place] [facility] [phone] (ASCOM)  [name] 05/10/2023, 9:29 AM

## 2023-05-11 ENCOUNTER — Ambulatory Visit: Attending: Cardiology

## 2023-05-11 ENCOUNTER — Ambulatory Visit (INDEPENDENT_AMBULATORY_CARE_PROVIDER_SITE_OTHER)

## 2023-05-11 ENCOUNTER — Telehealth: Payer: Self-pay | Admitting: Cardiology

## 2023-05-11 ENCOUNTER — Other Ambulatory Visit: Payer: Self-pay

## 2023-05-11 ENCOUNTER — Observation Stay (HOSPITAL_BASED_OUTPATIENT_CLINIC_OR_DEPARTMENT_OTHER)
Admit: 2023-05-11 | Discharge: 2023-05-11 | Disposition: A | Discharge status: Home / Self Care | Attending: Internal Medicine | Admitting: Internal Medicine

## 2023-05-11 DIAGNOSIS — I63533 Cerebral infarction due to unspecified occlusion or stenosis of bilateral posterior cerebral arteries: Secondary | ICD-10-CM

## 2023-05-11 DIAGNOSIS — I10 Essential (primary) hypertension: Secondary | ICD-10-CM | POA: Diagnosis not present

## 2023-05-11 DIAGNOSIS — I639 Cerebral infarction, unspecified: Secondary | ICD-10-CM

## 2023-05-11 DIAGNOSIS — I63431 Cerebral infarction due to embolism of right posterior cerebral artery: Secondary | ICD-10-CM | POA: Diagnosis not present

## 2023-05-11 DIAGNOSIS — F4323 Adjustment disorder with mixed anxiety and depressed mood: Secondary | ICD-10-CM | POA: Diagnosis not present

## 2023-05-11 DIAGNOSIS — E785 Hyperlipidemia, unspecified: Secondary | ICD-10-CM | POA: Diagnosis not present

## 2023-05-11 DIAGNOSIS — I6389 Other cerebral infarction: Secondary | ICD-10-CM | POA: Diagnosis not present

## 2023-05-11 DIAGNOSIS — C220 Liver cell carcinoma: Secondary | ICD-10-CM | POA: Diagnosis not present

## 2023-05-11 DIAGNOSIS — E119 Type 2 diabetes mellitus without complications: Secondary | ICD-10-CM | POA: Diagnosis not present

## 2023-05-11 DIAGNOSIS — G4733 Obstructive sleep apnea (adult) (pediatric): Secondary | ICD-10-CM

## 2023-05-11 LAB — ECHOCARDIOGRAM COMPLETE
Area-P 1/2: 2.96 cm2
S' Lateral: 2.7 cm
Weight: 3181.68 [oz_av]

## 2023-05-11 LAB — GLUCOSE, CAPILLARY
Glucose-Capillary: 115 mg/dL — ABNORMAL HIGH (ref 70–99)
Glucose-Capillary: 97 mg/dL (ref 70–99)

## 2023-05-11 MED ORDER — ASPIRIN 81 MG PO TBEC
81.0000 mg | DELAYED_RELEASE_TABLET | Freq: Every day | ORAL | 2 refills | Status: AC
  Filled 2023-05-11: qty 30, 30d supply, fill #0
  Filled 2023-06-13: qty 30, 30d supply, fill #1
  Filled 2023-07-12: qty 30, 30d supply, fill #2

## 2023-05-11 MED ORDER — ATORVASTATIN CALCIUM 40 MG PO TABS
40.0000 mg | ORAL_TABLET | Freq: Every day | ORAL | 0 refills | Status: AC
  Filled 2023-05-11: qty 30, 30d supply, fill #0

## 2023-05-11 MED ORDER — CLOPIDOGREL BISULFATE 75 MG PO TABS
75.0000 mg | ORAL_TABLET | Freq: Every day | ORAL | 2 refills | Status: DC
  Filled 2023-05-11: qty 30, 30d supply, fill #0
  Filled 2023-06-13: qty 30, 30d supply, fill #1
  Filled 2023-07-14: qty 30, 30d supply, fill #2

## 2023-05-11 NOTE — Progress Notes (Signed)
*  PRELIMINARY RESULTS* Echocardiogram 2D Echocardiogram has been performed.  Broadus Canes 05/11/2023, 7:53 AM

## 2023-05-11 NOTE — Plan of Care (Signed)
 Problem: Education: Goal: Knowledge of General Education information will improve Description: Including pain rating scale, medication(s)/side effects and non-pharmacologic comfort measures Outcome: Adequate for Discharge   Problem: Health Behavior/Discharge Planning: Goal: Ability to manage health-related needs will improve Outcome: Adequate for Discharge   Problem: Clinical Measurements: Goal: Ability to maintain clinical measurements within normal limits will improve Outcome: Adequate for Discharge Goal: Will remain free from infection Outcome: Adequate for Discharge Goal: Diagnostic test results will improve Outcome: Adequate for Discharge Goal: Respiratory complications will improve Outcome: Adequate for Discharge Goal: Cardiovascular complication will be avoided Outcome: Adequate for Discharge   Problem: Activity: Goal: Risk for activity intolerance will decrease Outcome: Adequate for Discharge   Problem: Nutrition: Goal: Adequate nutrition will be maintained Outcome: Adequate for Discharge   Problem: Coping: Goal: Level of anxiety will decrease Outcome: Adequate for Discharge   Problem: Elimination: Goal: Will not experience complications related to bowel motility Outcome: Adequate for Discharge Goal: Will not experience complications related to urinary retention Outcome: Adequate for Discharge   Problem: Pain Managment: Goal: General experience of comfort will improve and/or be controlled Outcome: Adequate for Discharge   Problem: Safety: Goal: Ability to remain free from injury will improve Outcome: Adequate for Discharge   Problem: Skin Integrity: Goal: Risk for impaired skin integrity will decrease Outcome: Adequate for Discharge   Problem: Education: Goal: Ability to describe self-care measures that may prevent or decrease complications (Diabetes Survival Skills Education) will improve Outcome: Adequate for Discharge Goal: Individualized Educational  Video(s) Outcome: Adequate for Discharge   Problem: Coping: Goal: Ability to adjust to condition or change in health will improve Outcome: Adequate for Discharge   Problem: Fluid Volume: Goal: Ability to maintain a balanced intake and output will improve Outcome: Adequate for Discharge   Problem: Health Behavior/Discharge Planning: Goal: Ability to identify and utilize available resources and services will improve Outcome: Adequate for Discharge Goal: Ability to manage health-related needs will improve Outcome: Adequate for Discharge   Problem: Metabolic: Goal: Ability to maintain appropriate glucose levels will improve Outcome: Adequate for Discharge   Problem: Nutritional: Goal: Maintenance of adequate nutrition will improve Outcome: Adequate for Discharge Goal: Progress toward achieving an optimal weight will improve Outcome: Adequate for Discharge   Problem: Skin Integrity: Goal: Risk for impaired skin integrity will decrease Outcome: Adequate for Discharge   Problem: Tissue Perfusion: Goal: Adequacy of tissue perfusion will improve Outcome: Adequate for Discharge   Problem: Education: Goal: Knowledge of disease or condition will improve Outcome: Adequate for Discharge Goal: Knowledge of secondary prevention will improve (MUST DOCUMENT ALL) Outcome: Adequate for Discharge Goal: Knowledge of patient specific risk factors will improve (DELETE if not current risk factor) Outcome: Adequate for Discharge   Problem: Ischemic Stroke/TIA Tissue Perfusion: Goal: Complications of ischemic stroke/TIA will be minimized Outcome: Adequate for Discharge   Problem: Coping: Goal: Will verbalize positive feelings about self Outcome: Adequate for Discharge Goal: Will identify appropriate support needs Outcome: Adequate for Discharge   Problem: Health Behavior/Discharge Planning: Goal: Ability to manage health-related needs will improve Outcome: Adequate for Discharge Goal:  Goals will be collaboratively established with patient/family Outcome: Adequate for Discharge   Problem: Self-Care: Goal: Ability to participate in self-care as condition permits will improve Outcome: Adequate for Discharge Goal: Verbalization of feelings and concerns over difficulty with self-care will improve Outcome: Adequate for Discharge Goal: Ability to communicate needs accurately will improve Outcome: Adequate for Discharge   Problem: Nutrition: Goal: Risk of aspiration will decrease Outcome: Adequate for Discharge  Goal: Dietary intake will improve Outcome: Adequate for Discharge

## 2023-05-11 NOTE — Telephone Encounter (Signed)
 Patient presented to [facility] with cryptogenic stroke. Requested ambulatory monitor to eval for AFib.

## 2023-05-11 NOTE — TOC Transition Note (Signed)
 Transition of Care [facility]) - Discharge Note   Patient Details  Name: Aaron Lowery MRN: [id number] Date of Birth: 05-Sep-1950  [facility] CM/SW Contact:  Crayton Docker, RN Phone Number: 05/11/2023, 12:43 PM   Clinical Narrative:    Discharge orders for home/self care noted. Private transportation arranged. CM call to patient's wife phone; [phone]. CM spoke to patient. Per patient, no voiced concerns or questions.    Final next level of care: Home/Self Care Barriers to Discharge: Continued Medical Work up   Patient Goals and CMS Choice    Home/self care  Discharge Placement     Home/self care           Discharge Plan and Services Additional resources added to the After Visit Summary for     Home/self care  Social Drivers of Health (SDOH) Interventions SDOH Screenings   Food Insecurity: No Food Insecurity (05/09/2023)  Housing: Unknown (05/09/2023)  Transportation Needs: No Transportation Needs (05/09/2023)  Utilities: Not At Risk (05/09/2023)  Depression (PHQ2-9): Low Risk  (10/25/2022)  Financial Resource Strain: Low Risk  (10/25/2022)  Physical Activity: Insufficiently Active (01/27/2021)   Received from [facility], [facility]  Social Connections: Socially Integrated (05/09/2023)  Stress: Stress Concern Present (01/27/2021)   Received from [facility], [facility]  Tobacco Use: Medium Risk (05/09/2023)     Readmission Risk Interventions     No data to display

## 2023-05-11 NOTE — Progress Notes (Signed)
 Discharge instructions reviewed with patient including followup visits and new medications.  Understanding was verbalized and all questions were answered.  IV removed without complication; patient tolerated well.  Awaiting confirmation of heart monitor placement prior to discharge, otherwise patient is stable to leave.

## 2023-05-11 NOTE — Discharge Summary (Addendum)
 Physician Discharge Summary   Patient: Aaron Lowery MRN: [id number] DOB: 05/01/1950  Admit date:     05/09/2023  Discharge date: 05/11/23  Discharge Physician: Donaciano Frizzle   PCP: [facility]   Recommendations at discharge:   Follow-up with PCP in 1 week. Follow-up with the neurology in 1 month. Follow-up with Dr. Gollan after heart monitor is completed.  Discharge Diagnoses: Principal Problem:   Stroke [facility]) Active Problems:   HTN (hypertension)   HLD (hyperlipidemia)   Diabetes mellitus without complication (HCC)   Iron deficiency anemia   Hepatocellular carcinoma (HCC)   Adjustment disorder with mixed anxiety and depressed mood   OSA (obstructive sleep apnea)  Resolved Problems:   * No resolved hospital problems. *  Hospital Course: Aaron Lowery is a 73 y.o. 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 Pt was LKN at 9:00 PM of 4/26. He states that he woke up on (4/27) morning at 9am with right-sided numbness, including numbness in mouth, right arm and right leg. He also had blurry vision.  He also complained of intermittent shortness of breath.  He was recently diagnosed with diabetes was started on metformin which sometimes causes mild diarrhea.  He stopped taking pravastatin because it caused muscle pain.   MRI of the brain: 1. Scattered posterior circulation infarcts involving the left thalamus and right PCA distribution as above. No associated hemorrhage or mass effect. 2. Underlying mild to moderate chronic microvascular ischemic disease with remote right thalamic lacunar infarct. 3. 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 Correlation with pituitary function tests recommended. Additionally, follow-up examination with  nonemergent pituitary protocol mass MRI suggested for further evaluation   He was found to have acute stroke. Assessment and Plan: Acute PCA stroke: MRI showed scattered posterior circulation infarcts involving the left thalamus and right PCA distribution. Right side numbness and blurry vision have improved. He did well with ST, PT and OT. Echocardiogram showed ejection fraction 55 to 60% without valvular disease, no PFO. Neurology recommended continue Lipitor, with dual antiplatelets for 90 days followed by single aspirin treatment.  Zio has been arranged to mail to him (refused loop recorder);  follow-up with Dr. Gollan for results.     Mild intermittent shortness of breath:  Oncologist notes from 04/27/2023 showed that patient has been complaining of chronic intermittent shortness of breath. Most likely is not asthma, chest x-ray no acute disease, duplex ultrasound of lower extremity did not show any DVT.   Type II DM: NovoLog as needed for hyperglycemia.  Metformin restarted. Hemoglobin A1c 7.3     Hyperlipidemia:  He had been taking pravastatin in the past but it caused nausea so he stopped. LDL 115.  Able to tolerated atorvastatin.     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   Last heart treatment (atezolizumab  and bevacizumab  infusion) on 04/27/2023 Outpatient follow-up with oncologist at [facility]. They thought he had ascites in the past but abdominal ultrasound did not show any ascites amenable for paracentesis.     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        Comorbidities include hypothyroidism, glaucoma, pseudophakia, depression, anxiety, chronic anemia         Consultants: Neurology Procedures performed: None  Disposition: Home Diet recommendation:  Discharge Diet Orders (From admission, onward)  Start     Ordered   05/11/23 0000  Diet - low sodium heart healthy        05/11/23 1140            Cardiac diet DISCHARGE MEDICATION: Allergies as of 05/11/2023       Reactions   Pravastatin    Muscle pain        Medication List     STOP taking these medications    hydrochlorothiazide 12.5 MG tablet Commonly known as: HYDRODIURIL   ibuprofen 800 MG tablet Commonly known as: ADVIL   levocetirizine 5 MG tablet Commonly known as: XYZAL   losartan 50 MG tablet Commonly known as: COZAAR   pravastatin 40 MG tablet Commonly known as: PRAVACHOL       TAKE these medications    aspirin EC 81 MG tablet Take 1 tablet (81 mg total) by mouth daily. Swallow whole. Start taking on: May 12, 2023   atorvastatin 40 MG tablet Commonly known as: LIPITOR Take 1 tablet (40 mg total) by mouth daily. Start taking on: May 12, 2023   clopidogrel 75 MG tablet Commonly known as: PLAVIX Take 1 tablet (75 mg total) by mouth daily. Start taking on: May 12, 2023   ferrous sulfate 325 (65 FE) MG tablet Take 1 tablet by mouth daily with breakfast.   furosemide 20 MG tablet Commonly known as: LASIX Take by mouth.   levothyroxine 75 MCG tablet Commonly known as: SYNTHROID Take 75 mcg by mouth daily before breakfast.   losartan-hydrochlorothiazide 50-12.5 MG tablet Commonly known as: HYZAAR Take 1 tablet by mouth daily.   metFORMIN 500 MG 24 hr tablet Commonly known as: GLUCOPHAGE-XR Take 500 mg by mouth daily with breakfast. Taking 250 mg per dose   mirtazapine  7.5 MG tablet Commonly known as: REMERON  Take 1 tablet (7.5 mg total) by mouth at bedtime.   Omega 3 1000 MG Caps Take 1 capsule by mouth daily.   Travoprost (BAK Free) 0.004 % Soln ophthalmic solution Commonly known as: TRAVATAN Place 1 drop into both eyes at bedtime.   traZODone 100 MG tablet Commonly known as: DESYREL Take 100 mg by mouth at bedtime.        Follow-up Information     [place] Follow up in 1 week(s).   Contact information: [address] [place] [address] [place]  [address] [phone]         [facility] Follow up in 1 month(s).   Contact information: [address] [place]   [address] [phone]        Devorah Fonder, MD Follow up in 1 month(s).   Specialty: Cardiology Why: after completion of cardiac monitoring Contact information: [address] [place] [address] [phone]                Discharge Exam: Cleavon Curls Weights   05/09/23 2202  Weight: 90.2 kg   General exam: Appears calm and comfortable  Respiratory system: Clear to auscultation. Respiratory effort normal. Cardiovascular system: S1 & S2 heard, RRR. No JVD, murmurs, rubs, gallops or clicks. No pedal edema. Gastrointestinal system: Abdomen is nondistended, soft and nontender. No organomegaly or masses felt. Normal bowel sounds heard. Central nervous system: Alert and oriented. No focal neurological deficits. Extremities: Symmetric 5 x 5 power. Skin: No rashes, lesions or ulcers Psychiatry: Judgement and insight appear normal. Mood & affect appropriate.    Condition at discharge: good  The results of significant diagnostics  from this hospitalization (including imaging, microbiology, ancillary and laboratory) are listed below for reference.   Imaging Studies: ECHOCARDIOGRAM COMPLETE Result Date: 05/11/2023    ECHOCARDIOGRAM REPORT   Patient Name:   YAEL FUEHRER Date of Exam: 05/11/2023 Medical Rec #:  [id number]     Height:       66.9 in Accession #:    [id number]    Weight:       198.9 lb Date of Birth:  [birth year]     BSA:          2.015 m Patient Age:    72 years      BP:           104/89 mmHg Patient Gender: M             HR:           67 bpm. Exam Location:  [facility] Procedure: 2D Echo, Cardiac Doppler, Color Doppler and Saline Contrast Bubble            Study (Both Spectral and Color Flow Doppler were utilized during            procedure). Indications:     Stroke I63.9  History:         Patient  has no prior history of Echocardiogram examinations.                  Risk Factors:Diabetes and Hypertension.  Sonographer:     Broadus Canes Referring Phys:  [id number] XILIN NIU Diagnosing Phys: Belva Boyden MD  Sonographer Comments: Suboptimal parasternal window. IMPRESSIONS  1. Left ventricular ejection fraction, by estimation, is 55 to 60%. Left ventricular ejection fraction by PLAX is 64 %. The left ventricle has normal function. The left ventricle has no regional wall motion abnormalities. Left ventricular diastolic parameters are consistent with Grade I diastolic dysfunction (impaired relaxation).  2. Right ventricular systolic function is normal. The right ventricular size is normal. There is normal pulmonary artery systolic pressure. The estimated right ventricular systolic pressure is 14.1 mmHg.  3. The mitral valve is normal in structure. No evidence of mitral valve regurgitation. No evidence of mitral stenosis.  4. The aortic valve is normal in structure. Aortic valve regurgitation is not visualized. No aortic stenosis is present.  5. The inferior vena cava is normal in size with greater than 50% respiratory variability, suggesting right atrial pressure of 3 mmHg.  6. Agitated saline contrast bubble study was negative, with no evidence of any interatrial shunt. FINDINGS  Left Ventricle: Left ventricular ejection fraction, by estimation, is 55 to 60%. Left ventricular ejection fraction by PLAX is 64 %. The left ventricle has normal function. The left ventricle has no regional wall motion abnormalities. Strain was performed and the global longitudinal strain is indeterminate. The left ventricular internal cavity size was normal in size. There is no left ventricular hypertrophy. Left ventricular diastolic parameters are consistent with Grade I diastolic dysfunction  (impaired relaxation). Right Ventricle: The right ventricular size is normal. No increase in right ventricular wall thickness. Right ventricular  systolic function is normal. There is normal pulmonary artery systolic pressure. The tricuspid regurgitant velocity is 1.51 m/s, and  with an assumed right atrial pressure of 5 mmHg, the estimated right ventricular systolic pressure is 14.1 mmHg. Left Atrium: Left atrial size was normal in size. Right Atrium: Right atrial size was normal in size. Pericardium: There is no evidence of pericardial effusion. Mitral Valve: The mitral valve is normal in structure. Mild mitral  annular calcification. No evidence of mitral valve regurgitation. No evidence of mitral valve stenosis. MV peak gradient, 3.1 mmHg. The mean mitral valve gradient is 2.0 mmHg. Tricuspid Valve: The tricuspid valve is normal in structure. Tricuspid valve regurgitation is mild . No evidence of tricuspid stenosis. Aortic Valve: The aortic valve is normal in structure. Aortic valve regurgitation is not visualized. No aortic stenosis is present. Pulmonic Valve: The pulmonic valve was normal in structure. Pulmonic valve regurgitation is not visualized. No evidence of pulmonic stenosis. Aorta: The aortic root is normal in size and structure. Venous: The inferior vena cava is normal in size with greater than 50% respiratory variability, suggesting right atrial pressure of 3 mmHg. IAS/Shunts: No atrial level shunt detected by color flow Doppler. Agitated saline contrast was given intravenously to evaluate for intracardiac shunting. Agitated saline contrast bubble study was negative, with no evidence of any interatrial shunt. There  is no evidence of a patent foramen ovale. There is no evidence of an atrial septal defect. Additional Comments: 3D was performed not requiring image post processing on an independent workstation and was indeterminate.  LEFT VENTRICLE PLAX 2D LV EF:         Left            Diastology                ventricular     LV e' medial:    5.11 cm/s                ejection        LV E/e' medial:  10.5                fraction by     LV e'  lateral:   6.85 cm/s                PLAX is 64      LV E/e' lateral: 7.8                %. LVIDd:         4.10 cm LVIDs:         2.70 cm LV PW:         0.80 cm LV IVS:        1.40 cm LVOT diam:     2.00 cm LVOT Area:     3.14 cm  RIGHT VENTRICLE RV Basal diam:  2.50 cm RV Mid diam:    2.60 cm RV S prime:     10.00 cm/s TAPSE (M-mode): 1.6 cm LEFT ATRIUM             Index        RIGHT ATRIUM           Index LA diam:        2.80 cm 1.39 cm/m   RA Area:     10.20 cm LA Vol (A2C):   36.4 ml 18.07 ml/m  RA Volume:   22.50 ml  11.17 ml/m LA Vol (A4C):   22.5 ml 11.17 ml/m LA Biplane Vol: 30.0 ml 14.89 ml/m   AORTA Ao Root diam: 3.30 cm MITRAL VALVE               TRICUSPID VALVE MV Area (PHT): 2.96 cm    TR Peak grad:   9.1 mmHg MV Peak grad:  3.1 mmHg    TR Vmax:        151.00 cm/s MV Mean grad:  2.0 mmHg MV Vmax:  0.88 m/s    SHUNTS MV Vmean:      60.6 cm/s   Systemic Diam: 2.00 cm MV Decel Time: 256 msec MV E velocity: 53.50 cm/s MV A velocity: 85.90 cm/s MV E/A ratio:  0.62 Belva Boyden MD Electronically signed by Belva Boyden MD Signature Date/Time: 05/11/2023/10:13:21 AM    Final    US  Venous Img Lower Bilateral (DVT) Result Date: 05/11/2023 CLINICAL DATA:  Leg pain EXAM: BILATERAL LOWER EXTREMITY VENOUS DOPPLER ULTRASOUND TECHNIQUE: Gray-scale sonography with compression, as well as color and duplex ultrasound, were performed to evaluate the deep venous system(s) from the level of the common femoral vein through the popliteal and proximal calf veins. COMPARISON:  None Available. FINDINGS: VENOUS Normal compressibility of the common femoral, superficial femoral, and popliteal veins, as well as the visualized calf veins. Visualized portions of profunda femoral vein and great saphenous vein unremarkable. No filling defects to suggest DVT on grayscale or color Doppler imaging. Doppler waveforms show normal direction of venous flow, normal respiratory plasticity and response to augmentation. OTHER  None. Limitations: none IMPRESSION: 1. No evidence of lower extremity DVT. Electronically Signed   By: Reagan Camera M.D.   On: 05/11/2023 07:55   DG Chest 2 View Result Date: 05/10/2023 CLINICAL DATA:  Shortness of breath. EXAM: CHEST - 2 VIEW COMPARISON:  September 24, 2022. FINDINGS: The heart size and mediastinal contours are within normal limits. Both lungs are clear. The visualized skeletal structures are unremarkable. IMPRESSION: No active cardiopulmonary disease. Electronically Signed   By: Rosalene Colon M.D.   On: 05/10/2023 17:16   CT ANGIO HEAD NECK W WO CM Result Date: 05/10/2023 CLINICAL DATA:  73 year old male with small acute PCA territory infarcts on MRI yesterday. History of liver mass. EXAM: CT ANGIOGRAPHY HEAD AND NECK TECHNIQUE: Multidetector CT imaging of the head and neck was performed using the standard protocol during bolus administration of intravenous contrast. Multiplanar CT image reconstructions and MIPs were obtained to evaluate the vascular anatomy. Carotid stenosis measurements (when applicable) are obtained utilizing NASCET criteria, using the distal internal carotid diameter as the denominator. RADIATION DOSE REDUCTION: This exam was performed according to the departmental dose-optimization program which includes automated exposure control, adjustment of the mA and/or kV according to patient size and/or use of iterative reconstruction technique. CONTRAST:  75mL OMNIPAQUE  IOHEXOL  350 MG/ML SOLN COMPARISON:  Brain MRI and head CT yesterday. FINDINGS: CTA NECK Skeleton: Widespread cervical spine disc and endplate degeneration. Bone mineralization is within normal limits. No acute or suspicious osseous lesion identified. Upper chest: Negative aside from mild atelectasis. Other neck: Punctate left parotid sialolithiasis. Otherwise within normal limits. Aortic arch: Calcified aortic atherosclerosis. 3 vessel arch. Tortuous distal arch and proximal descending aorta. Right carotid  system: Tortuous brachiocephalic artery and right CCA origin with no significant plaque or stenosis. Tortuous right carotid bifurcation and mildly tortuous cervical right ICA with no significant plaque or stenosis. Left carotid system: Similar tortuosity of the left CCA without plaque or stenosis. Minimal plaque at the posterior left ICA origin, and left ICA tortuosity without stenosis. Vertebral arteries: Tortuous right subclavian artery origin. Normal right vertebral artery origin. Right vertebral artery is tortuous to the skull base with no plaque or stenosis. Proximal left subclavian artery mild soft and calcified plaque without stenosis. Normal left vertebral artery origin. Left vertebral is mildly dominant, tortuous throughout the neck and to the skull base with no significant plaque or stenosis. CTA HEAD Posterior circulation: Distal vertebral arteries and vertebrobasilar junction are  patent without plaque or stenosis. Mildly dominant left V4. Both a ICAs appear dominant and patent. Patent basilar artery without stenosis. Patent SCA and left PCA origins. Fetal type right PCA origin. Left posterior communicating artery diminutive or absent. Short segment severe stenosis of the right PCA P2 on series 7, image 21 and series 11, image 14 with maintained distal enhancement. Contralateral moderate left PCA proximal P2 irregularity and stenosis (also image 21 and series 11, image 20) and severe left PCA P3 stenosis (same images) but preserved distal enhancement. Anterior circulation: Both ICA siphons are patent with minimal siphon plaque and no stenosis. Normal right posterior communicating artery origin. Enlarged sella turcica again noted, see brain MRI yesterday. Patent carotid termini, MCA and ACA origins. Diminutive or absent anterior communicating artery. Bilateral ACA branches are within normal limits. Left MCA M1 segment and bifurcation are patent with mild tortuosity, no stenosis. Right MCA M1 segment and  bifurcation similarly patent and tortuous without stenosis. Bilateral MCA branches are within normal limits. Venous sinuses: Early contrast timing, grossly patent. Anatomic variants: Mildly dominant left vertebral artery. Fetal type right PCA origin. Review of the MIP images confirms the above findings IMPRESSION: 1. Negative for large vessel occlusion. No significant carotid or anterior circulation atherosclerosis or stenosis. 2. Positive for multifocal bilateral PCA stenoses, but no PCA branch occlusion is identified: - Severe Right P2 stenosis. - Moderate Left P2 and Severe Left P3 stenoses. 3. No Vertebrobasilar atherosclerosis or stenosis. Note also fetal type right PCA origin. 4. Pituitary region mass, as per MRI yesterday. 5. Mild left parotid gland sialolithiasis. Electronically Signed   By: Marlise Simpers M.D.   On: 05/10/2023 04:16   MR BRAIN WO CONTRAST Result Date: 05/09/2023 CLINICAL DATA:  Initial evaluation for acute right-sided numbness, visual disturbance. EXAM: MRI HEAD WITHOUT CONTRAST TECHNIQUE: Multiplanar, multiecho pulse sequences of the brain and surrounding structures were obtained without intravenous contrast. COMPARISON:  Of CT from earlier the same day. FINDINGS: Brain: Cerebral volume within normal limits. Patchy T2/FLAIR hyperintensity involving the periventricular deep white matter both cerebral hemispheres, consistent with chronic small vessel ischemic disease, mild-to-moderate in nature. Remote lacunar infarct present at the right thalamus. 7 mm acute ischemic nonhemorrhagic left thalamic lacunar infarct (series 5, image 25). Additional small volume patchy restricted diffusion involving the mesial right temporal and right occipital lobes, consistent with patchy small volume acute right PCA distribution infarcts (series 5, images 21, 19). No associated hemorrhage or mass effect. No other evidence for acute or subacute ischemia. Gray-white matter differentiation otherwise maintained. No  acute intracranial hemorrhage. Few small chronic micro hemorrhages noted about the frontal lobes bilaterally, of doubtful significance. Expansile mass measuring up to 2.5 cm seen involving the pituitary gland/sella (series 7, image 13). Inferior extension through the floor of the sella into the sphenoid sinus, with slight suprasellar extension. 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 No other mass lesion, mass effect or midline shift. No hydrocephalus or extra-axial fluid collection. Vascular: Major intracranial vascular flow voids are maintained. Skull and upper cervical spine: Craniocervical junction within normal limits. Bone marrow signal intensity normal. No scalp soft tissue abnormality. Prior craniotomy on the left. Sinuses/Orbits: Prior bilateral ocular lens replacement. Probable postobstructive super a shins noted within the right sphenoid sinus. Paranasal sinuses are otherwise clear. No significant mastoid effusion. Other: None. IMPRESSION: 1. Scattered posterior circulation infarcts involving the left thalamus and right PCA distribution as above. No associated hemorrhage or mass effect. 2. Underlying mild to moderate chronic microvascular ischemic disease  with remote right thalamic lacunar infarct. 3. 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 Correlation with pituitary function tests recommended. Additionally, follow-up examination with nonemergent pituitary protocol mass MRI suggested for further evaluation. Electronically Signed   By: Virgia Griffins M.D.   On: 05/09/2023 19:50   CT HEAD WO CONTRAST Result Date: 05/09/2023 CLINICAL DATA:  Right-sided numbness and blurred vision beginning yesterday. EXAM: CT HEAD WITHOUT CONTRAST TECHNIQUE: Contiguous axial images were obtained from the base of the skull through the vertex without intravenous contrast. RADIATION DOSE REDUCTION: This exam was performed according to the departmental dose-optimization  program which includes automated exposure control, adjustment of the mA and/or kV according to patient size and/or use of iterative reconstruction technique. COMPARISON:  09/09/2021 FINDINGS: Brain: No evidence of intracranial hemorrhage, acute infarction, hydrocephalus, extra-axial collection, or intra-axial mass lesion. Mild diffuse cerebral atrophy and chronic small vessel disease again noted. A solid mass with bony expansion of the sella turcica is again noted measuring 1.7 x 1.7 cm, without significant change, consistent with a pituitary macroadenoma. Vascular:  No hyperdense vessel or other acute findings. Skull: No evidence of fracture or other significant bone abnormality. Old left frontal and parietal craniotomy defects again noted. Sinuses/Orbits:  No acute findings. Other: None. IMPRESSION: No acute intracranial abnormality. Stable mild cerebral atrophy and chronic small vessel disease. Stable intrasellar mass, consistent with pituitary macroadenoma. Electronically Signed   By: Marlyce Sine M.D.   On: 05/09/2023 15:43    Microbiology: Results for orders placed or performed during the hospital encounter of 09/24/22  SARS Coronavirus 2 by RT PCR (hospital order, performed in [facility] hospital lab) *cepheid single result test* Anterior Nasal Swab     Status: None   Collection Time: 09/24/22  5:33 PM   Specimen: Anterior Nasal Swab  Result Value Ref Range Status   SARS Coronavirus 2 by RT PCR NEGATIVE NEGATIVE Final    Comment: (NOTE) SARS-CoV-2 target nucleic acids are NOT DETECTED.  The SARS-CoV-2 RNA is generally detectable in upper and lower respiratory specimens during the acute phase of infection. The lowest concentration of SARS-CoV-2 viral copies this assay can detect is 250 copies / mL. A negative result does not preclude SARS-CoV-2 infection and should not be used as the sole basis for treatment or other patient management decisions.  A negative result may occur with improper  specimen collection / handling, submission of specimen other than nasopharyngeal swab, presence of viral mutation(s) within the areas targeted by this assay, and inadequate number of viral copies (<250 copies / mL). A negative result must be combined with clinical observations, patient history, and epidemiological information.  Fact Sheet for Patients:   RoadLapTop.co.za  Fact Sheet for Healthcare Providers: http://kim-miller.com/  This test is not yet approved or  cleared by the [place]  FDA and has been authorized for detection and/or diagnosis of SARS-CoV-2 by FDA under an Emergency Use Authorization (EUA).  This EUA will remain in effect (meaning this test can be used) for the duration of the COVID-19 declaration under Section 564(b)(1) of the Act, 21 U.S.C. section 360bbb-3(b)(1), unless the authorization is terminated or revoked sooner.  Performed at [facility], [address] [place], [place] [address]     Labs: CBC: Recent Labs  Lab 05/09/23 1257  WBC 7.3  NEUTROABS 3.9  HGB 11.8*  HCT 38.2*  MCV 72.1*  PLT 323   Basic Metabolic Panel: Recent Labs  Lab 05/09/23 1257  NA 135  K  4.0  CL 104  CO2 23  GLUCOSE 155*  BUN 20  CREATININE 1.14  CALCIUM 9.1   Liver Function Tests: Recent Labs  Lab 05/09/23 1257  AST 22  ALT 18  ALKPHOS 41  BILITOT 0.5  PROT 7.3  ALBUMIN 3.7   CBG: Recent Labs  Lab 05/10/23 1141 05/10/23 1641 05/10/23 2010 05/11/23 0833 05/11/23 1134  GLUCAP 151* 118* 128* 115* 97    Discharge time spent: greater than 30 minutes.  Signed: Donaciano Frizzle, MD [facility] 05/11/2023

## 2023-05-11 NOTE — Consult Note (Signed)
 ELECTROPHYSIOLOGY CONSULT NOTE  Patient ID: Kaimi Pfisterer MRN: [id number], DOB/AGE: Nov 10, 1950   Admit date: 05/09/2023 Date of Consult: 05/11/2023  Primary Physician: [facility] Primary Cardiologist: None  Primary Electrophysiologist: New to None  Reason for Consultation: Cryptogenic stroke; recommendations regarding Implantable Loop Recorder Insurance: [employer]  History of Present Illness EP has been asked to evaluate Timm Foot for placement of an implantable loop recorder to monitor for atrial fibrillation by Dr  Cleone Dad .  The patient was admitted on 05/09/2023 with Right face, arm, and leg numbness.    Imaging demonstrated R thalamic and L occipital strokes.    He has undergone workup for stroke including:   CT Head without contrast(Personally reviewed): No acute intracranial abnormality. Stable mild cerebral atrophy and chronic small vessel disease. Stable intrasellar mass, consistent with pituitary macroadenoma.   CT angio Head and Neck with contrast(Personally reviewed): 1. Negative for large vessel occlusion. No significant carotid or anterior circulation atherosclerosis or stenosis. 2. Positive for multifocal bilateral PCA stenoses, but no PCA branch occlusion is identified: - Severe Right P2 stenosis. - Moderate Left P2 and Severe Left P3 stenoses. 3. No Vertebrobasilar atherosclerosis or stenosis. Note also fetal type right PCA origin. 4. Pituitary region mass, as per MRI yesterday. 5. Mild left parotid gland sialolithiasis.   MRI Brain(Personally reviewed): 1. Scattered posterior circulation infarcts involving the left thalamus and right PCA distribution as above. No associated hemorrhage or mass effect. 2. Underlying mild to moderate chronic microvascular ischemic disease with remote right thalamic lacunar infarct. 3. 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 Correlation with pituitary function  tests recommended. Additionally, follow-up examination with nonemergent pituitary protocol mass MRI suggested for further evaluation.   LE Duplex:  Negative for DVT   ECHO:   1. Left ventricular ejection fraction, by estimation, is 55 to 60%. Left  ventricular ejection fraction by PLAX is 64 %. The left ventricle has  normal function. The left ventricle has no regional wall motion  abnormalities. Left ventricular diastolic  parameters are consistent with Grade I diastolic dysfunction (impaired  relaxation).   2. Right ventricular systolic function is normal. The right ventricular  size is normal. There is normal pulmonary artery systolic pressure. The  estimated right ventricular systolic pressure is 14.1 mmHg.   3. The mitral valve is normal in structure. No evidence of mitral valve  regurgitation. No evidence of mitral stenosis.   4. The aortic valve is normal in structure. Aortic valve regurgitation is  not visualized. No aortic stenosis is present.   5. The inferior vena cava is normal in size with greater than 50%  respiratory variability, suggesting right atrial pressure of 3 mmHg.   6. Agitated saline contrast bubble study was negative, with no evidence  of any interatrial shunt.    The patient has been monitored on telemetry which has demonstrated sinus rhythm with no arrhythmias.  Inpatient stroke work-up will not require a TEE per Neurology.   Echocardiogram as above. Lab work is reviewed.  Prior to admission, the patient denies chest pain, shortness of breath, dizziness, palpitations, or syncope.  He is recovering from his stroke with plans to return home  at discharge.  Allergies, Past Medical, Surgical, Social, and Family Histories have been reviewed and are referenced here-in when relevant for medical decision making.   Inpatient Medications:   aspirin EC  81 mg Oral Daily   atorvastatin  40 mg Oral Daily   clopidogrel  75 mg Oral Daily   enoxaparin (LOVENOX)  injection  45 mg Subcutaneous Q24H   ferrous sulfate  325 mg Oral Q breakfast   insulin aspart  0-5 Units Subcutaneous QHS   insulin aspart  0-9 Units Subcutaneous TID WC   latanoprost  1 drop Both Eyes QHS   levothyroxine  75 mcg Oral Q0600   mirtazapine   7.5 mg Oral QHS   omega-3 acid ethyl esters  1 capsule Oral Daily   pneumococcal 20-valent conjugate vaccine  0.5 mL Intramuscular Tomorrow-1000    Physical Exam: Vitals:   05/10/23 1957 05/10/23 2351 05/11/23 0356 05/11/23 0816  BP: (!) 151/99 126/72 104/89 125/72  Pulse: 83 91 67 69  Resp:  20 18 18   Temp: (!) 97.4 F (36.3 C) 98.3 F (36.8 C) 97.7 F (36.5 C) 98.1 F (36.7 C)  TempSrc:    Oral  SpO2: 98% 97% 99% 100%  Weight:        GEN- NAD. A&O x 3. Normal affect. HEENT: Normocephalic, atraumatic Lungs- CTAB, Normal effort.  Heart- Regular rate and rhythm rate and rhythm. No M/G/R.  Extremities- No peripheral edema. no clubbing or cyanosis Skin- warm and dry, no rash or lesion. Neuro non-focal   12-lead ECG  (personally reviewed) All prior EKG's in EPIC reviewed with no documented atrial fibrillation  Telemetry - patient refused (personally reviewed)  Assessment and Plan:  1. Cryptogenic stroke The patient presents with cryptogenic stroke.  The patient does not have a TEE planned for this AM.  I spoke at length with the patient about monitoring for afib with an implantable loop recorder.  Risks, benefits, and alteratives to implantable loop recorder were discussed with the patient today.   At this time, the patient refuses loop consideration, and has opted to wear an event monitor. If the patient chooses to leave and be considered for outpatient loop recorder, the co-pay usually ranges from $0-250 depending on insurance. The patient or family can call insurance and provide CPT code 86578.     Please call with questions.     Sabree Nuon, NP 05/11/2023 10:59 AM

## 2023-05-11 NOTE — Progress Notes (Signed)
 NEUROLOGY CONSULT FOLLOW UP NOTE   Date of service: May 11, 2023 Patient Name: Aaron Lowery MRN:  [id number] DOB:  11-27-1950  Interval Hx/subjective   - No new complaints, feeling well   Vitals   Vitals:   05/10/23 1742 05/10/23 1957 05/10/23 2351 05/11/23 0356  BP: (!) 140/93 (!) 151/99 126/72 104/89  Pulse: 78 83 91 67  Resp: 19  20 18   Temp: 98.7 F (37.1 C) (!) 97.4 F (36.3 C) 98.3 F (36.8 C) 97.7 F (36.5 C)  TempSrc: Oral     SpO2: 96% 98% 97% 99%  Weight:        Current vital signs: BP 104/89   Pulse 67   Temp 97.7 F (36.5 C)   Resp 18   Wt 90.2 kg Comment: From O/V note on 05/02/23  SpO2 99%   BMI 31.24 kg/m  Vital signs in last 24 hours: Temp:  [97.4 F (36.3 C)-98.7 F (37.1 C)] 97.7 F (36.5 C) (04/30 0356) Pulse Rate:  [67-91] 67 (04/30 0356) Resp:  [18-20] 18 (04/30 0356) BP: (104-151)/(72-99) 104/89 (04/30 0356) SpO2:  [96 %-99 %] 99 % (04/30 0356)  Body mass index is 31.24 kg/m.  Physical Exam   Sleeping but awakens easily, breathing comfortably, no acute distress   Neurologic Examination   Awake, alert, fluent casual speech  No truncal ataxia sitting on the side of the bed Using all extremities equally  Near normal casual gait, slightly wide based but steady   Medications  Current Facility-Administered Medications:    acetaminophen (TYLENOL) tablet 650 mg, 650 mg, Oral, Q4H PRN **OR** acetaminophen (TYLENOL) 160 MG/5ML solution 650 mg, 650 mg, Per Tube, Q4H PRN **OR** acetaminophen (TYLENOL) suppository 650 mg, 650 mg, Rectal, Q4H PRN, Niu, Xilin, MD   aspirin EC tablet 81 mg, 81 mg, Oral, Daily, Willy Pinkerton L, MD   atorvastatin (LIPITOR) tablet 40 mg, 40 mg, Oral, Daily, Niu, Xilin, MD, 40 mg at 05/10/23 6578   [COMPLETED] clopidogrel (PLAVIX) tablet 300 mg, 300 mg, Oral, Once, 300 mg at 05/10/23 1750 **FOLLOWED BY** clopidogrel (PLAVIX) tablet 75 mg, 75 mg, Oral, Daily, Marceil Welp L, MD   enoxaparin (LOVENOX) injection  45 mg, 45 mg, Subcutaneous, Q24H, Niu, Xilin, MD, 45 mg at 05/10/23 4696   ferrous sulfate tablet 325 mg, 325 mg, Oral, Q breakfast, Niu, Xilin, MD, 325 mg at 05/10/23 2952   hydrALAZINE (APRESOLINE) injection 5 mg, 5 mg, Intravenous, Q2H PRN, Niu, Xilin, MD   insulin aspart (novoLOG) injection 0-5 Units, 0-5 Units, Subcutaneous, QHS, Niu, Xilin, MD   insulin aspart (novoLOG) injection 0-9 Units, 0-9 Units, Subcutaneous, TID WC, Niu, Xilin, MD, 2 Units at 05/10/23 1225   latanoprost (XALATAN) 0.005 % ophthalmic solution 1 drop, 1 drop, Both Eyes, QHS, Niu, Xilin, MD, 1 drop at 05/10/23 2024   levothyroxine (SYNTHROID) tablet 75 mcg, 75 mcg, Oral, Q0600, Niu, Xilin, MD, 75 mcg at 05/11/23 0534   mirtazapine  (REMERON ) tablet 7.5 mg, 7.5 mg, Oral, QHS, Niu, Xilin, MD, 7.5 mg at 05/10/23 2021   omega-3 acid ethyl esters (LOVAZA) capsule 1 g, 1 capsule, Oral, Daily, Niu, Xilin, MD, 1 g at 05/10/23 0824   ondansetron (ZOFRAN) injection 4 mg, 4 mg, Intravenous, Q8H PRN, Niu, Xilin, MD   pneumococcal 20-valent conjugate vaccine (PREVNAR 20) injection 0.5 mL, 0.5 mL, Intramuscular, Tomorrow-1000, Niu, Xilin, MD   senna-docusate (Senokot-S) tablet 1 tablet, 1 tablet, Oral, QHS PRN, Niu, Xilin, MD   traZODone (DESYREL) tablet 50 mg, 50 mg,  Oral, QHS PRN, Niu, Xilin, MD  Labs and Diagnostic Imaging   CBC:  Recent Labs  Lab 05/09/23 1257  WBC 7.3  NEUTROABS 3.9  HGB 11.8*  HCT 38.2*  MCV 72.1*  PLT 323    Basic Metabolic Panel:  Lab Results  Component Value Date   NA 135 05/09/2023   K 4.0 05/09/2023   CO2 23 05/09/2023   GLUCOSE 155 (H) 05/09/2023   BUN 20 05/09/2023   CREATININE 1.14 05/09/2023   CALCIUM 9.1 05/09/2023   GFRNONAA >60 05/09/2023   Lipid Panel:  Lab Results  Component Value Date   CHOL 185 05/10/2023   HDL 49 05/10/2023   LDLCALC 115 (H) 05/10/2023   TRIG 103 05/10/2023   CHOLHDL 3.8 05/10/2023    HgbA1c:  Lab Results  Component Value Date   HGBA1C 7.3 (H)  05/09/2023   Urine Drug Screen:     Component Value Date/Time   LABOPIA NONE DETECTED 05/10/2023 1742   COCAINSCRNUR NONE DETECTED 05/10/2023 1742   LABBENZ NONE DETECTED 05/10/2023 1742   AMPHETMU NONE DETECTED 05/10/2023 1742   THCU NONE DETECTED 05/10/2023 1742   LABBARB NONE DETECTED 05/10/2023 1742    Alcohol Level     Component Value Date/Time   ETH <15 05/09/2023 1257   INR  Lab Results  Component Value Date   INR 1.1 05/09/2023   APTT  Lab Results  Component Value Date   APTT 30 05/09/2023   CT Head without contrast(Personally reviewed): No acute intracranial abnormality. Stable mild cerebral atrophy and chronic small vessel disease. Stable intrasellar mass, consistent with pituitary macroadenoma.   CT angio Head and Neck with contrast(Personally reviewed): 1. Negative for large vessel occlusion. No significant carotid or anterior circulation atherosclerosis or stenosis. 2. Positive for multifocal bilateral PCA stenoses, but no PCA branch occlusion is identified: - Severe Right P2 stenosis. - Moderate Left P2 and Severe Left P3 stenoses. 3. No Vertebrobasilar atherosclerosis or stenosis. Note also fetal type right PCA origin. 4. Pituitary region mass, as per MRI yesterday. 5. Mild left parotid gland sialolithiasis.   MRI Brain(Personally reviewed): 1. Scattered posterior circulation infarcts involving the left thalamus and right PCA distribution as above. No associated hemorrhage or mass effect. 2. Underlying mild to moderate chronic microvascular ischemic disease with remote right thalamic lacunar infarct. 3. 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 Correlation with pituitary function tests recommended. Additionally, follow-up examination with nonemergent pituitary protocol mass MRI suggested for further evaluation.  LE Duplex:  Negative for DVT  ECHO:  Report pending  Assessment   Aaron Lowery is a 73 y.o.  male with multiple stroke risk factors as above including hypertension, hyperlipidemia, untreated sleep apnea, diabetes, hepatocellular carcinoma and use of bevacizumab , as well as remote smoking, BMI 31.24,   With fetal right PCA, he has involvement of both the anterior and posterior circulation on his strokes in addition to involvement of both hemispheres.  This is highly concerning for central embolic source   Etiology may be cardioembolic, hypercoagulable state of malignancy is possible, also consider role of bevacizumab   Recommendations  # Left thalamic stroke and scattered right PCA embolic appearing strokes, etiology concerning for central embolic process - Patient is tolerating atorvastatin 40 mg well, recommend continuing this medication, consider addition of coenzyme Q10 if he has recurrent myalgias (discussed this optional over the counter supplement with patient) - Echocardiogram completed, report pending - 30 day event monitor on discharge, referral to cardiology for consideration  of loop recorder if event monitor is negative  - Lower extremity duplex to assess for hypercoagulable state of malignancy (DVTs) resulted negative  - Prophylactic therapy-Antiplatelet med: Aspirin - dose 325mg  PO or 300mg  PR, followed by 81 mg daily  - Plavix 300 mg load with 75 mg daily for 90 day course due to considerable multifocal intracranial athero including bilateral PCAs making atheroembolic etiologies a significant consideration  - If an indication for anticoagulation is found would discontinue antiplatelet therapy from a stroke perspective - Will need to follow-up with oncology regarding risk/benefit of continuing bevacizumab  - Risk factor modification: Diet, exercise and weight loss counseling - Telemetry monitoring; 30 day event monitor on discharge if no arrythmias captured  - Blood pressure goal: Normotension - Would repeat sleep study outpatient and treat sleep apnea if found as this is  another important modifiable risk factor for stroke - Neurology will sign off at this time, please reach out if additional questions or concerns arise - Dicussed with primary team via secure chat ______________________________________________________________________   Baldwin Levee MD-PhD [facility] [phone] [facility] coverage for [facility] is from 8 AM to 4 AM in-house and 4 PM to 8 PM by telephone/video. 8 PM to 8 AM emergent questions or overnight urgent questions should be addressed to Teleneurology On-call or Arlin Benes neurohospitalist; contact information can be found on AMION   >50 min spent in care, majority in discussion with patient regarding assessment and plan as above including review of imaging with patient/family

## 2023-05-11 NOTE — Progress Notes (Signed)
 Patient refused telemetry monitor at this time, hospitalist made aware, will continue to monitor.

## 2023-05-11 NOTE — Telephone Encounter (Signed)
 Ordered ZIO monitor x2.

## 2023-05-13 ENCOUNTER — Other Ambulatory Visit: Payer: Self-pay | Admitting: Oncology

## 2023-05-29 IMAGING — CR DG CHEST 2V
1 series · 2 of 2 positions shown · non-contrast
Comparison: None.

CLINICAL DATA: Shortness of breath for 3 weeks

EXAM:
CHEST - 2 VIEW

[Series 1: dg chest 2 view · 0.14mm/px · 2 of 2 slices shown]
[im 1/2]
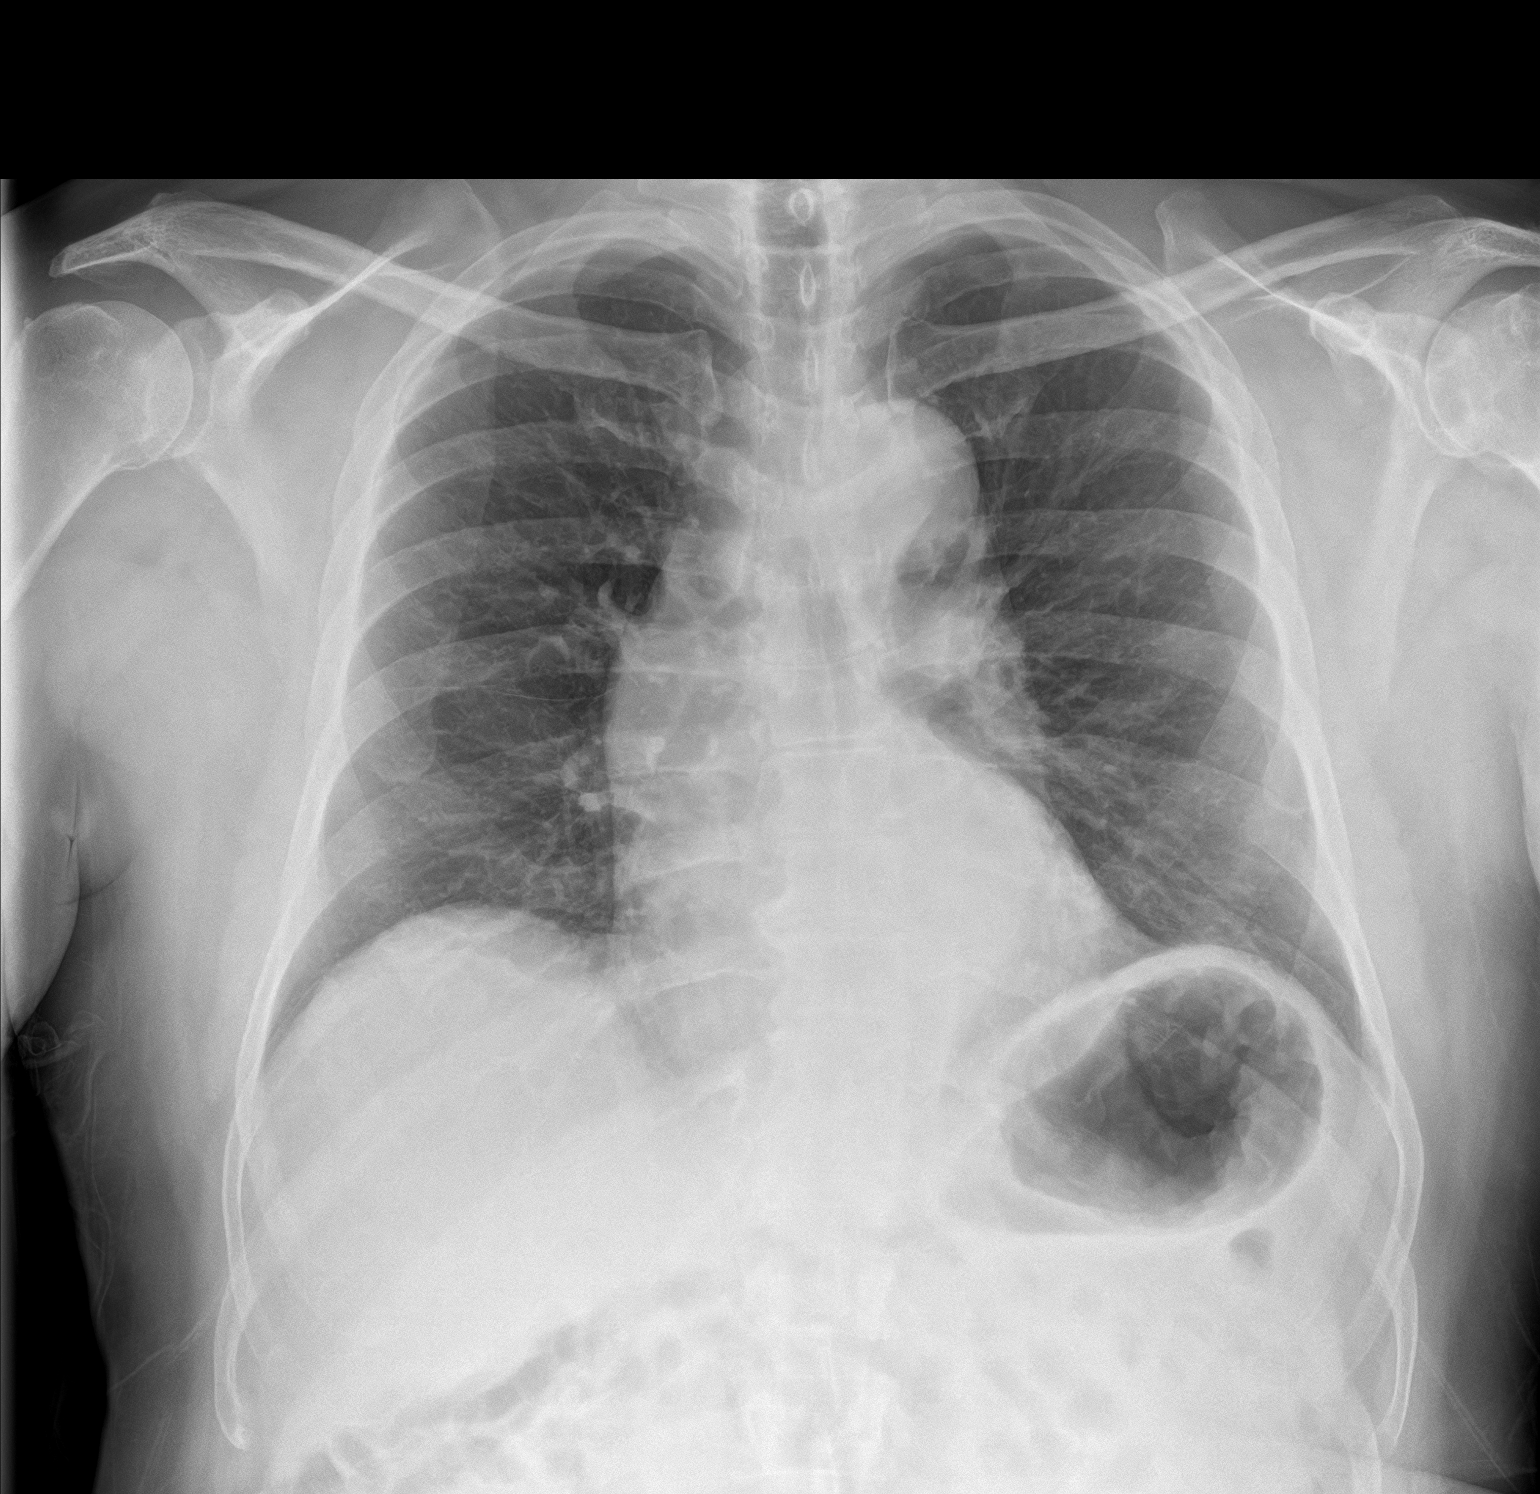
[im 2/2]
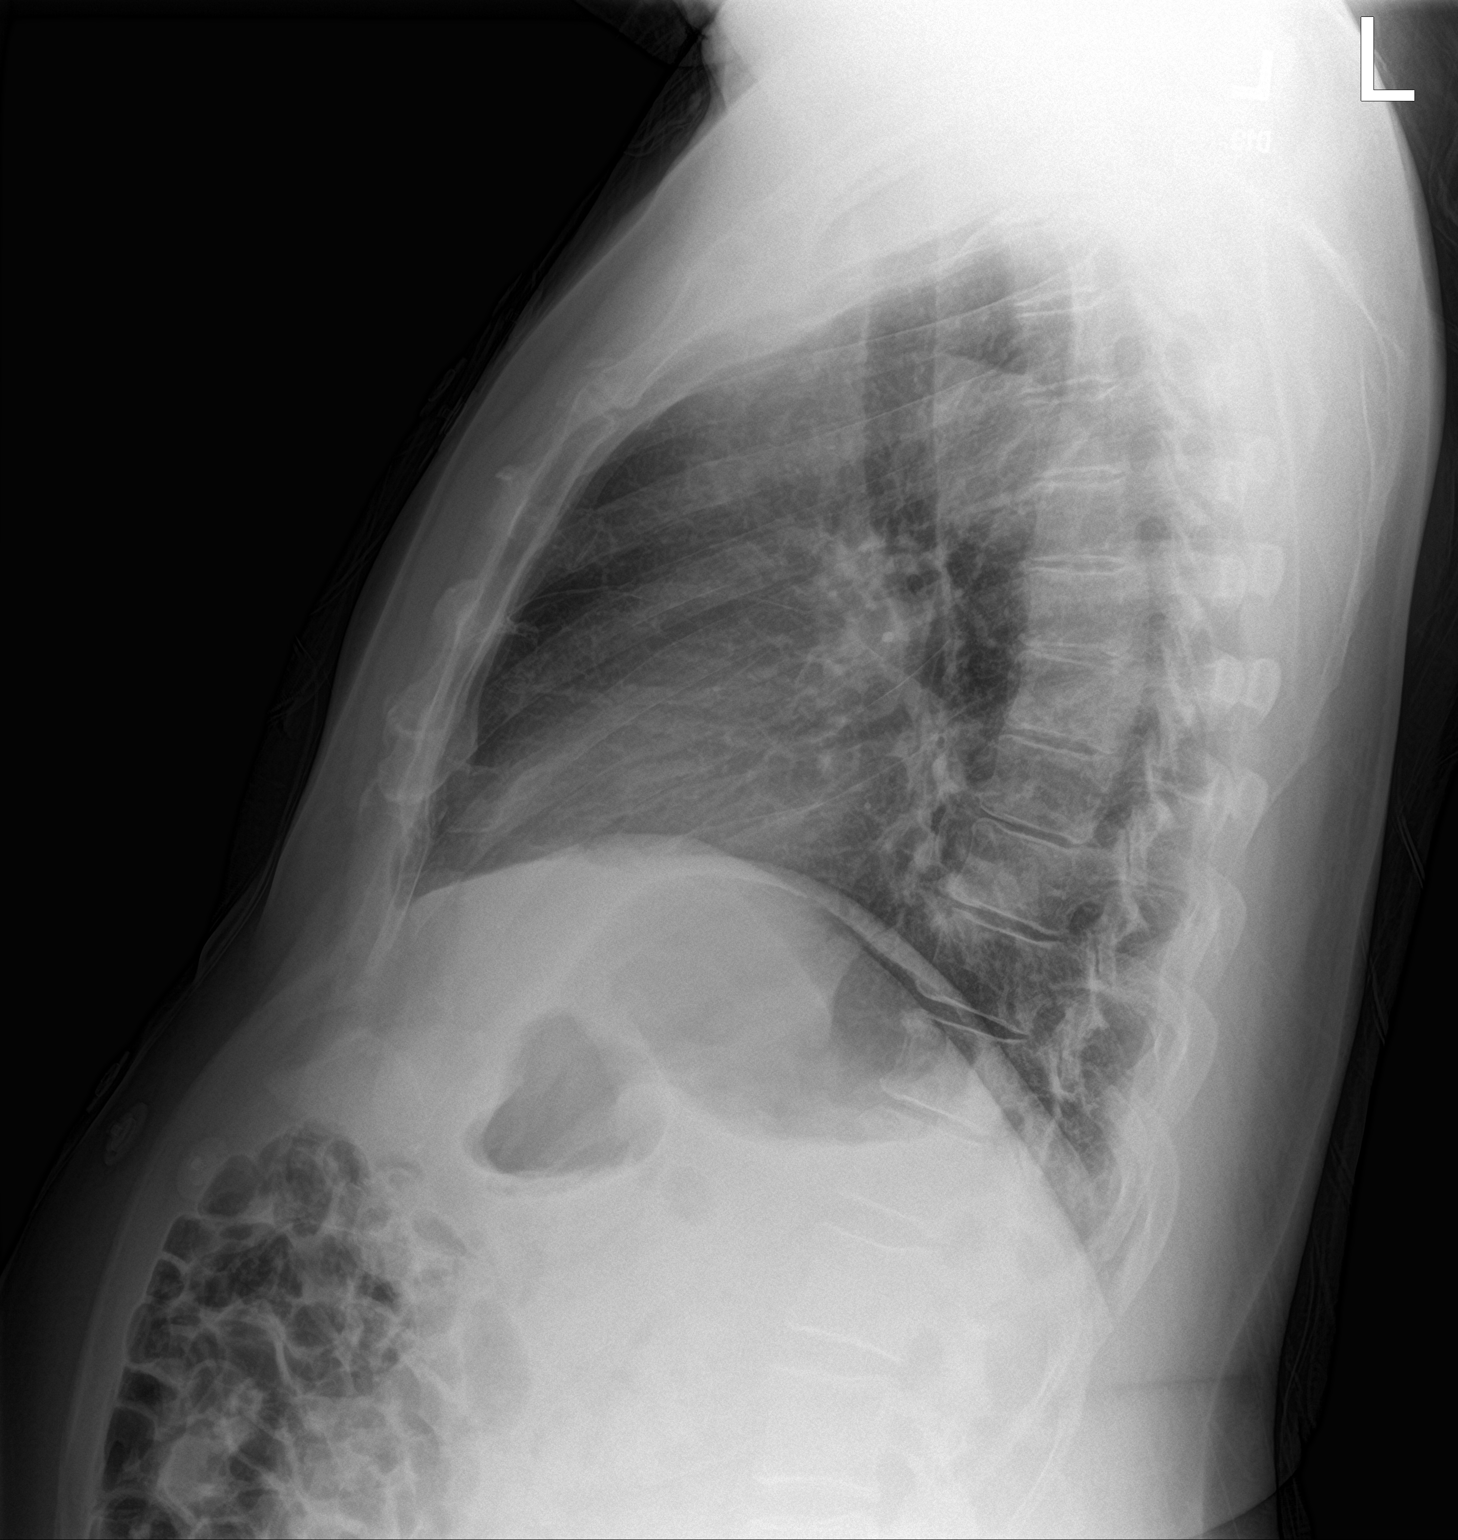

[2 of 2 positions shown; findings below may reference images not displayed]

FINDINGS: Frontal and lateral views of the chest demonstrate an unremarkable
cardiac silhouette. No acute airspace disease, effusion, or
pneumothorax. No acute bony abnormality.
IMPRESSION: 1. No acute intrathoracic process.

## 2023-06-06 ENCOUNTER — Other Ambulatory Visit: Payer: Self-pay | Admitting: Oncology

## 2023-06-09 ENCOUNTER — Ambulatory Visit: Admitting: Podiatry

## 2023-06-10 ENCOUNTER — Ambulatory Visit: Attending: Cardiology | Admitting: Cardiology

## 2023-06-10 ENCOUNTER — Encounter: Payer: Self-pay | Admitting: Cardiology

## 2023-06-10 ENCOUNTER — Other Ambulatory Visit: Payer: Self-pay | Admitting: Oncology

## 2023-06-10 VITALS — BP 120/80 | HR 89 | Ht 67.0 in | Wt 204.1 lb

## 2023-06-10 DIAGNOSIS — R0602 Shortness of breath: Secondary | ICD-10-CM

## 2023-06-10 DIAGNOSIS — E119 Type 2 diabetes mellitus without complications: Secondary | ICD-10-CM

## 2023-06-10 DIAGNOSIS — I63533 Cerebral infarction due to unspecified occlusion or stenosis of bilateral posterior cerebral arteries: Secondary | ICD-10-CM

## 2023-06-10 DIAGNOSIS — C220 Liver cell carcinoma: Secondary | ICD-10-CM

## 2023-06-10 DIAGNOSIS — I639 Cerebral infarction, unspecified: Secondary | ICD-10-CM | POA: Diagnosis not present

## 2023-06-10 DIAGNOSIS — I10 Essential (primary) hypertension: Secondary | ICD-10-CM | POA: Diagnosis not present

## 2023-06-10 DIAGNOSIS — E785 Hyperlipidemia, unspecified: Secondary | ICD-10-CM

## 2023-06-10 DIAGNOSIS — G4733 Obstructive sleep apnea (adult) (pediatric): Secondary | ICD-10-CM

## 2023-06-10 NOTE — Progress Notes (Signed)
 Cardiology Office Note   Date:  06/12/2023  ID:  Aaron Kenning., DOB 11/22/1950, MRN [id number] PCP: Samual Crochet, MD  [facility] Providers Cardiologist:  None     History of Present Illness Aaron Pritt. is a 73 y.o. 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  He was recently hospitalized at [facility] 4/28-4/30/25.  On arrival to the emergency room he stated that he had woke up on 4/27 the morning at 9 AM with right-sided numbness, including numbness in the mouth, right arm, right leg.  He also had blurry vision.  He had complained of intermittent shortness of breath.  Noted he is also recently been diagnosed with diabetes and was started on metformin which sometimes cause mild diarrhea.  He also had previously been taking pravastatin but stopped due to myalgias.  With his complaints he had an MRI of the brain that revealed scattered posterior circulation infarcts involving the left thalamus and the right PCA distribution.  No hemorrhage or mass.  Underlying mild to moderate chronic microvascular ischemic disease with remote right ophthalmic lunatic or infarct.  2.5 cm mass involving the pituitary likely pituitary macroadenoma.  He did well with his speech, physical, and occupational therapies.  Echocardiogram revealed an LVEF of 55 to 60% without valvular disease and no PFO.  Neurology recommended continued Lipitor with dual antiplatelets for 90 days followed by monotherapy with aspirin .  A ZIO monitor was arranged to be mailed to him as he refused loop recorder during hospitalization.  His chest x-ray revealed no acute processes.  He had a duplex of the lower extremity that was  negative for DVT.  On discharge metformin was restarted for his hemoglobin A1c of 7.3.  His LDL was noted at 115 and he was started on atorvastatin .  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  He is considered stable for discharge and was discharged home on 05/11/2023.  He returns clinic today stating overall from a cardiac perspective he has been doing fairly well.  He continues to suffer from chronic shortness of breath and dyspnea on exertion is unchanged prior to his hospitalization.  He denies any chest pain, chest pressure, or palpitations.  Denies any swelling to his bilateral lower extremities.  States that he has been compliant with his current medication regimen without undue side effects.  He has mailed back 1 ZIO monitor and is currently wearing his second ZIO monitor today.  ROS: 10 point review of systems has been reviewed and considered negative except ones been listed in the HPI  Studies Reviewed EKG Interpretation Date/Time:  Friday Jun 10 2023 09:32:52 EDT Ventricular Rate:  89 PR Interval:  214 QRS Duration:  98 QT Interval:  370 QTC Calculation: 450 R Axis:   -47  Text Interpretation: Sinus rhythm with 1st degree A-V block Left axis deviation Nonspecific T wave abnormality When compared with ECG of 09-May-2023 12:50, Borderline criteria for Lateral infarct are no longer Present Confirmed by Aaron Lowery ([id number]) on 06/10/2023 9:41:13 AM    2D echo 05/11/2023 1. Left ventricular ejection fraction, by estimation, is 55 to 60%. Left  ventricular ejection fraction by PLAX is 64 %. The left ventricle has  normal function. The left ventricle has no regional wall motion  abnormalities. Left ventricular diastolic  parameters are consistent with Grade I diastolic dysfunction (impaired  relaxation).   2. Right ventricular systolic function is normal. The right ventricular   size is normal. There is normal pulmonary artery systolic pressure. The  estimated right ventricular systolic pressure is 14.1 mmHg.   3. The mitral valve is normal in structure. No evidence of mitral valve  regurgitation. No evidence of mitral stenosis.   4. The aortic valve is normal in structure. Aortic valve regurgitation is  not visualized. No aortic stenosis is present.   5. The inferior vena cava is normal in size with greater than 50%  respiratory variability, suggesting right atrial pressure of 3 mmHg.   6. Agitated saline contrast bubble study was negative, with no evidence  of any interatrial shunt.   Risk Assessment/Calculations     Physical Exam VS:  BP 120/80 (BP Location: Left Arm, Patient Position: Sitting, Cuff Size: Normal)   Pulse 89   Ht 5\' 7"  (1.702 m)   Wt 204 lb 2 oz (92.6 kg)   SpO2 95%   BMI 31.97 kg/m    Wt Readings from Last 3 Encounters:  06/10/23 204 lb 2 oz (92.6 kg)  05/09/23 198 lb 13.7 oz (90.2 kg)  03/17/23 193 lb 11.2 oz (87.9 kg)    GEN: Well nourished, well developed in no acute distress NECK: No JVD; No carotid bruits CARDIAC: RRR, no murmurs, rubs, gallops RESPIRATORY:  Clear to auscultation without rales, wheezing or rhonchi  ABDOMEN: Soft, non-tender, non-distended EXTREMITIES:  Trace pretibial edema; No deformity   ASSESSMENT AND PLAN Cryptogenic stroke with patient did well with therapy without any concerning deficits.  He continues to have a second ZIO monitor on today.  First a ZIO monitor preliminary report was reviewed and showed no atrial fibrillation.  Discussed that if the monitors do not show anything we will send him back to EP for further discussion of loop recorder.  Patient still declined during hospitalization. Echocardiogram was completed during hospitalization but not TEE. Will revisit continued discuss on return. Today first-degree AV block with a rate of 89 with a left axis deviation and nonspecific T waves.  Advised the  patient that thus far there has been no atrial fibrillation noted but if A-fib is noted then changes in his medication will need to be made.  He has been continued on aspirin  81 mg daily, clopidogrel  75 mg daily for 90 days and then will go to monotherapy with aspirin  81 mg daily.  Primary hypertension with a blood pressure today 120/80.  Blood pressures remain stable.  He has continued on losartan 50 mg twice daily.  Of note during the patient's hospitalization his HCTZ was discontinued.  He has been encouraged to continue to monitor pressures 1 to 2 hours postmedication administration as well.  Chronic shortness of breath is unchanged over the last several months.  During his hospitalization he underwent echocardiogram that revealed an LVEF of 55 to 60%, no RWMA, G1 DD, and no valvular abnormality.  Likely component of deconditioning. Mixed hyperlipidemia with an LDL 115.  Goal of less than 100.  He is continued on atorvastatin  40 mg daily.  He will need an updated lipid and hepatic panel in 2 months as he has been on therapy for approximately a month prior to this appointment.  Type 2 diabetes where he previously was taking metformin but had worsening diarrhea and had to stop  the medication.  He was to reach out to his PCP and determine what medications that he was to continue.  There was large confusion surrounding his medications today.  Ongoing management by his PCP.  Obstructive sleep apnea not on CPAP where he stated a new sleep study had been ordered.  It had to be rescheduled.  Did discuss the importance of if he had a history of sleep apnea wearing CPAP was imperative to helping prevent arrhythmias and other chronic health problems.  Hepatocellular carcinoma on immunotherapy where his last treatment was on 04/27/2023.  He is encouraged to continue with his outpatient follow-ups with his oncologist at [facility].        Dispo: Patient to return to clinic to see MD/APP in 6 weeks or sooner for  reevaluation after second monitor has resulted.  Patient stated that 6 weeks was too long so follow-up appointment was made for 4-week return.  Signed, Danella Philson, NP

## 2023-06-10 NOTE — Patient Instructions (Signed)
 Medication Instructions:  Your Physician recommend you continue on your current medication as directed.    *If you need a refill on your cardiac medications before your next appointment, please call your pharmacy*  Lab Work: No labs ordered today  If you have labs (blood work) drawn today and your tests are completely normal, you will receive your results only by: MyChart Message (if you have MyChart) OR A paper copy in the mail If you have any lab test that is abnormal or we need to change your treatment, we will call you to review the results.  Testing/Procedures: No test ordered today   Follow-Up: At [facility], you and your health needs are our priority.  As part of our continuing mission to provide you with exceptional heart care, our providers are all part of one team.  This team includes your primary Cardiologist (physician) and Advanced Practice Providers or APPs (Physician Assistants and Nurse Practitioners) who all work together to provide you with the care you need, when you need it.  Your next appointment:   6 week(s) with EP  Provider:   Ronald Cockayne, NP

## 2023-06-11 DIAGNOSIS — I639 Cerebral infarction, unspecified: Secondary | ICD-10-CM

## 2023-06-12 ENCOUNTER — Ambulatory Visit: Payer: Self-pay | Admitting: Cardiology

## 2023-06-12 ENCOUNTER — Encounter: Payer: Self-pay | Admitting: Cardiology

## 2023-06-13 ENCOUNTER — Other Ambulatory Visit (HOSPITAL_COMMUNITY): Payer: Self-pay

## 2023-06-13 ENCOUNTER — Other Ambulatory Visit: Payer: Self-pay

## 2023-06-14 ENCOUNTER — Other Ambulatory Visit: Payer: Self-pay

## 2023-06-14 ENCOUNTER — Ambulatory Visit: Admitting: Podiatry

## 2023-06-14 ENCOUNTER — Ambulatory Visit (INDEPENDENT_AMBULATORY_CARE_PROVIDER_SITE_OTHER)

## 2023-06-14 ENCOUNTER — Encounter: Payer: Self-pay | Admitting: Podiatry

## 2023-06-14 VITALS — Ht 67.0 in | Wt 204.1 lb

## 2023-06-14 DIAGNOSIS — M722 Plantar fascial fibromatosis: Secondary | ICD-10-CM | POA: Diagnosis not present

## 2023-06-14 DIAGNOSIS — M778 Other enthesopathies, not elsewhere classified: Secondary | ICD-10-CM

## 2023-06-14 MED ORDER — BETAMETHASONE SOD PHOS & ACET 6 (3-3) MG/ML IJ SUSP
3.0000 mg | Freq: Once | INTRAMUSCULAR | Status: AC
  Administered 2023-06-14: 3 mg via INTRA_ARTICULAR

## 2023-06-14 MED ORDER — METHYLPREDNISOLONE 4 MG PO TBPK: ORAL_TABLET | ORAL | 0 refills | Status: AC

## 2023-06-14 NOTE — Progress Notes (Signed)
   Chief Complaint  Patient presents with   Foot Pain    Pt is here due to bilateral foot pain that has been going on for about 6 months no injury to his feet, state the pain is in the arch and heel of both feet, he would like information on orthotics.    Subjective: 73 y.o. male presenting today for evaluation of heel pain bilateral.  Ongoing for about 6+ months.  Idiopathic onset.  No history of injury.  He has not done anything for treatment   Past Medical History:  Diagnosis Date   Diabetes mellitus without complication (HCC)    Hepatocellular carcinoma (HCC)    Hypercholesteremia    Hypertension    Iron deficiency anemia    Pituitary adenoma (HCC)    Past Surgical History:  Procedure Laterality Date   Brain tumor surgery     Allergies  Allergen Reactions   Pravastatin     Muscle pain      Objective: Physical Exam General: The patient is alert and oriented x3 in no acute distress.  Dermatology: Skin is warm, dry and supple bilateral lower extremities. Negative for open lesions or macerations bilateral.   Vascular: Dorsalis Pedis and Posterior Tibial pulses palpable bilateral.  Capillary fill time is immediate to all digits.  Neurological: Grossly intact via light touch  Musculoskeletal: Tenderness to palpation to the plantar aspect of the bilateral heels along the plantar fascia. All other joints range of motion within normal limits bilateral. Strength 5/5 in all groups bilateral.   Radiographic exam B/L feet 06/14/2023: Normal osseous mineralization. Joint spaces preserved. No fracture/dislocation/boney destruction. No other soft tissue abnormalities or radiopaque foreign bodies.  Small plantar heel spur noted bilateral  Assessment: 1. plantar fasciitis bilateral feet  Plan of Care:  -Patient evaluated. Xrays reviewed.   -Injection of 0.5cc Celestone soluspan injected into the bilateral heels.  -Prescription for Medrol Dosepak -No NSAIDs.  Patient on  Eliquis -Refrain from going barefoot.  Recommend good supportive tennis shoes and sneakers even around the house to support the arch of the foot -Return to clinic PRN  Dot Gazella, DPM [facility]  Dr. Dot Gazella, DPM    [address] [place], [place] [address]                Office [phone]  Fax [phone]

## 2023-06-15 ENCOUNTER — Other Ambulatory Visit: Payer: Self-pay

## 2023-06-16 ENCOUNTER — Telehealth: Payer: Self-pay

## 2023-06-16 ENCOUNTER — Other Ambulatory Visit: Payer: Self-pay

## 2023-06-16 NOTE — Telephone Encounter (Signed)
 Clinical Social Work was referred by [facility] for caregiver support.  CSW attempted to contact spouse, Devra Fontana, by phone.  Left voicemail with contact information and request for return call.  Also attempted to call patient, but his voicemail was full.

## 2023-06-27 ENCOUNTER — Other Ambulatory Visit: Payer: Self-pay | Admitting: Oncology

## 2023-07-04 DIAGNOSIS — I639 Cerebral infarction, unspecified: Secondary | ICD-10-CM | POA: Diagnosis not present

## 2023-07-05 NOTE — Progress Notes (Unsigned)
 Electrophysiology Office Note:    Date:  07/06/2023   ID:  Quintin Quin Raddle., DOB 01/22/1950, MRN [id number]  [facility] Cardiologist:  None  [facility] Electrophysiologist:  None   Referring MD: Gerard Frederick, NP   Chief Complaint: Cryptogenic stroke  History of Present Illness:    Mr. Comas is a 73 year old man who I am seeing today for an evaluation of cryptogenic stroke at the request of Joen Fake, NP.  The patient has a history of hypertension, hyperlipidemia, diabetes, hypothyroidism, depression, anemia.  He also has hepatocellular carcinoma on immunotherapy, sleep apnea not on CPAP.  He was hospitalized in April of this year with cryptogenic stroke.  MRI of the brain showed scattered posterior circulation infarcts.      Their past medical, social and family history was reviewed.   ROS:   Please see the history of present illness.    All other systems reviewed and are negative.  EKGs/Labs/Other Studies Reviewed:    The following studies were reviewed today:  Jun 11, 2023 and July 04, 2023 ZIO monitors reviewed.  No atrial fibrillation.  EKGs reviewed.  No evidence of atrial fibrillation      Physical Exam:    VS:  BP 121/81   Pulse 87   Ht 5' 7 (1.702 m)   Wt 202 lb (91.6 kg)   SpO2 95%   BMI 31.64 kg/m     Wt Readings from Last 3 Encounters:  07/06/23 202 lb (91.6 kg)  06/14/23 204 lb 2.1 oz (92.6 kg)  06/10/23 204 lb 2 oz (92.6 kg)     GEN: no distress CARD: RRR, No MRG RESP: No IWOB. CTAB.        ASSESSMENT AND PLAN:    1. Cryptogenic stroke (HCC)   2. Primary hypertension     #Cryptogenic Stroke Pathophysiology of cryptogenic stroke was discussed in detail during today's clinic appointment. I discussed the role of loop recorder monitoring in patients who have suffered a CVA/TIA. There has been no evidence of AF thus far in the patient's evaluation. Loop recorder monitors were discussed in detail including the implant  procedure and its risks. I discussed the monthly monitoring costs associated with loop recorder monitoring. The patient would like to proceed with ILR implant.  #Hypertension at goal today.  Recommend checking blood pressures 1-2 times per week at home and recording the values.  Recommend bringing these recordings to the primary care physician.      Signed, Ole DASEN. Cindie, MD, [facility], [facility] 07/06/2023 10:54 AM    Electrophysiology [facility]  ------------------  SURGEON:  OLE DASEN CINDIE, MD     PREPROCEDURE DIAGNOSIS:  Cryptogenic stroke    POSTPROCEDURE DIAGNOSIS: Cryptogenic stroke     PROCEDURES:   1. Implantable loop recorder implantation    INTRODUCTION:  Dene Nazir. presents with a history of cryptogenic stroke The costs of loop recorder monitoring have been discussed with the patient.    DESCRIPTION OF PROCEDURE:  Informed written consent was obtained.  A preprocedural timeout was performed with the RN Hunt[place]). The patient required no sedation for the procedure today.  Mapping over the patient's chest was performed to identify the area where electrograms were most prominent for ILR recording.  This area was found to be the left parasternal region over the 4th intercostal space. The patients left chest was therefore prepped and draped in the usual sterile fashion. The skin overlying the left parasternal region was infiltrated with lidocaine  for  local analgesia.  A 0.5-cm incision was made over the left parasternal region over the 3rd intercostal space.  A subcutaneous ILR pocket was fashioned using a combination of sharp and blunt dissection.  A Medtronic Reveal LINQ 2 [id number]) implantable loop recorder was then placed into the pocket  R waves were very prominent and measured >0.54mV.  Steri- Strips and a sterile dressing were then applied.  There were no early apparent complications.     CONCLUSIONS:   1. Successful implantation of a  implantable loop recorder for Cryptogenic stroke  2. No early apparent complications.   Ole T. Cindie, MD, [facility], [facility] Cardiac Electrophysiology

## 2023-07-06 ENCOUNTER — Ambulatory Visit: Attending: Cardiology | Admitting: Cardiology

## 2023-07-06 ENCOUNTER — Other Ambulatory Visit: Payer: Self-pay | Admitting: Oncology

## 2023-07-06 VITALS — BP 121/81 | HR 87 | Ht 67.0 in | Wt 202.0 lb

## 2023-07-06 DIAGNOSIS — I10 Essential (primary) hypertension: Secondary | ICD-10-CM

## 2023-07-06 DIAGNOSIS — I639 Cerebral infarction, unspecified: Secondary | ICD-10-CM

## 2023-07-06 NOTE — Patient Instructions (Signed)
Medication Instructions:  Your physician recommends that you continue on your current medications as directed. Please refer to the Current Medication list given to you today.  Labwork: None ordered.  Testing/Procedures: None ordered.  Follow-Up:  As needed with Dr. Lalla Brothers  Implantable Loop Recorder Placement, Care After This sheet gives you information about how to care for yourself after your procedure. Your health care provider may also give you more specific instructions. If you have problems or questions, contact your health care provider. What can I expect after the procedure? After the procedure, it is common to have: Soreness or discomfort near the incision. Some swelling or bruising near the incision.  Follow these instructions at home: Incision care  Monitor your cardiac device site for redness, swelling, and drainage. Call the device clinic at [phone] if you experience these symptoms or fever/chills.  Keep the large square bandage on your site for 24 hours and then you may remove it yourself. Keep the steri-strips underneath in place.   You may shower after 72 hours / 3 days from your procedure with the steri-strips in place. They will usually fall off on their own, or may be removed after 10 days. Pat dry.   Avoid lotions, ointments, or perfumes over your incision until it is well-healed.  Please do not submerge in water until your site is completely healed.   Your device is MRI compatible.   Remote monitoring is used to monitor your cardiac device from home. This monitoring is scheduled every month by our office. It allows [place] to keep an eye on the function of your device to ensure it is working properly.  If your wound site starts to bleed apply pressure.    For help with the monitor please call Medtronic Monitor Support Specialist directly at [phone].    If you have any questions/concerns please call the device clinic at  [phone].  Activity  Return to your normal activities.  General instructions Follow instructions from your health care provider about how to manage your implantable loop recorder and transmit the information. Learn how to activate a recording if this is necessary for your type of device. You may go through a metal detection gate, and you may let someone hold a metal detector over your chest. Show your ID card if needed. Do not have an MRI unless you check with your health care provider first. Take over-the-counter and prescription medicines only as told by your health care provider. Keep all follow-up visits as told by your health care provider. This is important. Contact a health care provider if: You have redness, swelling, or pain around your incision. You have a fever. You have pain that is not relieved by your pain medicine. You have triggered your device because of fainting (syncope) or because of a heartbeat that feels like it is racing, slow, fluttering, or skipping (palpitations). Get help right away if you have: Chest pain. Difficulty breathing. Summary After the procedure, it is common to have soreness or discomfort near the incision. Change your dressing as told by your health care provider. Follow instructions from your health care provider about how to manage your implantable loop recorder and transmit the information. Keep all follow-up visits as told by your health care provider. This is important. This information is not intended to replace advice given to you by your health care provider. Make sure you discuss any questions you have with your health care provider. Document Released: 12/09/2014 Document Revised: 02/12/2017 Document Reviewed: 02/12/2017 Elsevier Patient  Education  2020 [employer]vier Inc.  

## 2023-07-12 ENCOUNTER — Other Ambulatory Visit: Payer: Self-pay

## 2023-07-19 ENCOUNTER — Emergency Department
Admission: EM | Admit: 2023-07-19 | Discharge: 2023-07-19 | Disposition: A | Discharge status: Home / Self Care | Attending: Emergency Medicine | Admitting: Emergency Medicine

## 2023-07-19 ENCOUNTER — Encounter: Payer: Self-pay | Admitting: Intensive Care

## 2023-07-19 ENCOUNTER — Emergency Department

## 2023-07-19 ENCOUNTER — Other Ambulatory Visit: Payer: Self-pay

## 2023-07-19 DIAGNOSIS — I10 Essential (primary) hypertension: Secondary | ICD-10-CM | POA: Insufficient documentation

## 2023-07-19 DIAGNOSIS — E119 Type 2 diabetes mellitus without complications: Secondary | ICD-10-CM | POA: Insufficient documentation

## 2023-07-19 DIAGNOSIS — R200 Anesthesia of skin: Secondary | ICD-10-CM | POA: Diagnosis present

## 2023-07-19 DIAGNOSIS — M65321 Trigger finger, right index finger: Secondary | ICD-10-CM | POA: Diagnosis not present

## 2023-07-19 DIAGNOSIS — R202 Paresthesia of skin: Secondary | ICD-10-CM

## 2023-07-19 DIAGNOSIS — Z8505 Personal history of malignant neoplasm of liver: Secondary | ICD-10-CM | POA: Diagnosis not present

## 2023-07-19 LAB — CBC WITH DIFFERENTIAL/PLATELET
Abs Immature Granulocytes: 0.02 K/uL (ref 0.00–0.07)
Basophils Absolute: 0 K/uL (ref 0.0–0.1)
Basophils Relative: 0 %
Eosinophils Absolute: 0.2 K/uL (ref 0.0–0.5)
Eosinophils Relative: 3 %
HCT: 36.2 % — ABNORMAL LOW (ref 39.0–52.0)
Hemoglobin: 11.4 g/dL — ABNORMAL LOW (ref 13.0–17.0)
Immature Granulocytes: 0 %
Lymphocytes Relative: 24 %
Lymphs Abs: 1.6 K/uL (ref 0.7–4.0)
MCH: 22.8 pg — ABNORMAL LOW (ref 26.0–34.0)
MCHC: 31.5 g/dL (ref 30.0–36.0)
MCV: 72.3 fL — ABNORMAL LOW (ref 80.0–100.0)
Monocytes Absolute: 0.6 K/uL (ref 0.1–1.0)
Monocytes Relative: 9 %
Neutro Abs: 4.2 K/uL (ref 1.7–7.7)
Neutrophils Relative %: 64 %
Platelets: 281 K/uL (ref 150–400)
RBC: 5.01 MIL/uL (ref 4.22–5.81)
RDW: 17.2 % — ABNORMAL HIGH (ref 11.5–15.5)
WBC: 6.7 K/uL (ref 4.0–10.5)
nRBC: 0 % (ref 0.0–0.2)

## 2023-07-19 LAB — COMPREHENSIVE METABOLIC PANEL WITH GFR
ALT: 18 U/L (ref 0–44)
AST: 24 U/L (ref 15–41)
Albumin: 3.6 g/dL (ref 3.5–5.0)
Alkaline Phosphatase: 51 U/L (ref 38–126)
Anion gap: 9 (ref 5–15)
BUN: 15 mg/dL (ref 8–23)
CO2: 23 mmol/L (ref 22–32)
Calcium: 9.2 mg/dL (ref 8.9–10.3)
Chloride: 105 mmol/L (ref 98–111)
Creatinine, Ser: 1.15 mg/dL (ref 0.61–1.24)
GFR, Estimated: >60 mL/min (ref >60)
Glucose, Bld: 174 mg/dL — ABNORMAL HIGH (ref 70–99)
Potassium: 4.5 mmol/L (ref 3.5–5.1)
Sodium: 137 mmol/L (ref 135–145)
Total Bilirubin: 0.7 mg/dL (ref 0.0–1.2)
Total Protein: 6.7 g/dL (ref 6.5–8.1)

## 2023-07-19 NOTE — ED Provider Notes (Signed)
 [facility] Provider Note    Event Date/Time   First MD Initiated Contact with Patient 07/19/23 1113     (approximate)   History   Numbness and Tingling   HPI  Aaron Lowery. is a 73 y.o. male  with a past medical history of CVA, hepatocellular carcinoma, hyperlipidemia, hypertension, diabetes mellitus, IDA presenting to the emergency department with numbness and tingling in right hand for a month.  Patient was seen for stroke on 05/09/2023 and has had these symptoms since then.  Denies pain in hands, weakness, neck pain, fever, hitting head, LOC, numbness or tingling in bilateral lower extremities.  He did have a fall 2 weeks ago and hit his right elbow, does report pain there.  He also has a concern of his right index finger getting locked up after writing that has been present for years.      Physical Exam   Triage Vital Signs: ED Triage Vitals  Encounter Vitals Group     BP 07/19/23 1055 118/74     Girls Systolic BP Percentile --      Girls Diastolic BP Percentile --      Boys Systolic BP Percentile --      Boys Diastolic BP Percentile --      Pulse Rate 07/19/23 1055 89     Resp 07/19/23 1055 16     Temp 07/19/23 1055 97.8 F (36.6 C)     Temp Source 07/19/23 1055 Oral     SpO2 07/19/23 1055 93 %     Weight 07/19/23 1056 200 lb (90.7 kg)     Height 07/19/23 1056 5' 7 (1.702 m)     Head Circumference --      Peak Flow --      Pain Score 07/19/23 1055 4     Pain Loc --      Pain Education --      Exclude from Growth Chart --     Most recent vital signs: Vitals:   07/19/23 1125 07/19/23 1302  BP: 125/71 123/70  Pulse: 88 82  Resp: 18 18  Temp:    SpO2: 98% 100%    General: Awake, in no acute distress. Appears stated age. Head: Normocephalic, atraumatic. Neck: Supple, no nuchal rigidity. CV: Regular rate, 82 bpm. Peripheral pulses 2+ and symmetric. No edema. Respiratory: Breath sounds clear b/l. No wheezes, rales, or rhonchi.  No respiratory distress. Normal respiratory effort. GI: Soft, non-distended. MSK: Normal ROM and  5/5 strength in bilateral upper extremities. TTP on right olecranon.  No cervical spinal tenderness. Skin:Warm, dry, intact. No rashes, lesions, or ecchymosis. No cyanosis or pallor. Neurological: A&Ox4 to person, place, time, and situation. Cranial nerves II-XII intact. Sensation intact to bilateral upper extremities. Strength symmetric. No focal deficits.  Psychiatric: Mood and affect appropriate. Thought processes coherent.   ED Results / Procedures / Treatments   Labs (all labs ordered are listed, but only abnormal results are displayed) Labs Reviewed  CBC WITH DIFFERENTIAL/PLATELET - Abnormal; Notable for the following components:      Result Value   Hemoglobin 11.4 (*)    HCT 36.2 (*)    MCV 72.3 (*)    MCH 22.8 (*)    RDW 17.2 (*)    All other components within normal limits  COMPREHENSIVE METABOLIC PANEL WITH GFR - Abnormal; Notable for the following components:   Glucose, Bld 174 (*)    All other components within normal limits     EKG  RADIOLOGY X-ray of right elbow ordered.    IMPRESSION: Subcutaneous edema along the olecranon. Otherwise, no acute fracture or dislocation.  I independently viewed the x-ray and radiologist's report.  I agree with the radiologist's report that there are no acute fractures or dislocations.   PROCEDURES:  Critical Care performed: No   Procedures   MEDICATIONS ORDERED IN ED: Medications - No data to display   IMPRESSION / MDM / ASSESSMENT AND PLAN / ED COURSE  I reviewed the triage vital signs and the nursing notes.                              Differential diagnosis includes, but is not limited to, paresthesia of right hand, fracture of proximal ulna or radius, diabetic neuropathy, trigger finger  Patient's presentation is most consistent with acute complicated illness / injury requiring diagnostic workup.  Patient is  a 73 year old male who presented today with paresthesia of the right hand, right elbow pain, and suspected trigger finger.  Physical exam shows no strength or sensation abnormalities in bilateral upper extremities, no neck tenderness.  Right elbow x-ray with some subcutaneous edema, but no acute fracture or dislocation.  We discussed using ice to help with his swelling in his elbow as well as time for it to fully heal following his fall.    I do think his paresthesia in his hand is a result of his stroke.  He is not having any stroke-like symptoms currently.  No emergent concerns at this time. I would like him to follow-up with his primary care provider regarding this.  Also gave him follow-up information for orthopedics regarding his trigger finger on the right index finger.  Patient was given the opportunity to ask questions; all questions were answered. Emergency department return precautions were discussed with the patient.  Patient is in agreement to the treatment plan.  Patient is stable for discharge.      FINAL CLINICAL IMPRESSION(S) / ED DIAGNOSES   Final diagnoses:  Trigger finger, right index finger  Paresthesias     Rx / DC Orders   ED Discharge Orders     None        Note:  This document was prepared using Dragon voice recognition software and may include unintentional dictation errors.     Aaron Salm, PA-C 07/19/23 1355    Aaron Artist POUR, MD 07/19/23 [phone]

## 2023-07-19 NOTE — Discharge Instructions (Addendum)
 You were seen in the emergency department for numbness of your right hand.  I believe this is due to your prior stroke.  Please follow-up with your primary care provider as listed.  Please return to the emergency department for any developing pain or weakness in that right hand as well as any new or concerning symptoms.  You can follow-up with the orthopedic listed in the paperwork regarding your trigger finger.

## 2023-07-19 NOTE — ED Triage Notes (Signed)
 Patient c/o numbness and tingling in bilateral hands for over a month. Reports right side is worse.   States he had mechanical fall two weeks ago and hit right elbow. That's when right hand tingling and numbness became worse. Denies hitting head

## 2023-07-19 NOTE — ED Notes (Signed)
 Pt is CAOx4, breathing normally, and normal in color. Pt complaining of right elbow pain x2 weeks after he slipped and fell on same. Pt also complaining of tingling in the right arm and hand that has been going on for weeks. PT in NAD and denies any needs at this time.

## 2023-07-22 ENCOUNTER — Other Ambulatory Visit: Payer: Self-pay | Admitting: Oncology

## 2023-08-06 ENCOUNTER — Ambulatory Visit (INDEPENDENT_AMBULATORY_CARE_PROVIDER_SITE_OTHER)

## 2023-08-06 DIAGNOSIS — I639 Cerebral infarction, unspecified: Secondary | ICD-10-CM

## 2023-08-08 LAB — CUP PACEART REMOTE DEVICE CHECK
Date Time Interrogation Session: 20250726203314
Implantable Pulse Generator Implant Date: 20250625

## 2023-08-11 ENCOUNTER — Ambulatory Visit: Payer: Self-pay | Admitting: Cardiology

## 2023-08-14 ENCOUNTER — Other Ambulatory Visit: Payer: Self-pay | Admitting: Oncology

## 2023-08-18 ENCOUNTER — Other Ambulatory Visit: Payer: Self-pay | Admitting: Cardiology

## 2023-08-18 ENCOUNTER — Other Ambulatory Visit: Payer: Self-pay

## 2023-08-19 ENCOUNTER — Other Ambulatory Visit: Payer: Self-pay

## 2023-08-19 NOTE — Telephone Encounter (Signed)
 They should be refilled by his neurologist. He is being followed them for is stroke.

## 2023-08-22 ENCOUNTER — Other Ambulatory Visit: Payer: Self-pay

## 2023-08-23 ENCOUNTER — Other Ambulatory Visit: Payer: Self-pay

## 2023-08-23 ENCOUNTER — Other Ambulatory Visit: Payer: Self-pay | Admitting: Cardiology

## 2023-08-25 ENCOUNTER — Other Ambulatory Visit: Payer: Self-pay

## 2023-08-25 MED FILL — Clopidogrel Bisulfate Tab 75 MG (Base Equiv): ORAL | 30 days supply | Qty: 30 | Fill #0 | Status: AC

## 2023-09-06 ENCOUNTER — Ambulatory Visit

## 2023-09-06 DIAGNOSIS — I63533 Cerebral infarction due to unspecified occlusion or stenosis of bilateral posterior cerebral arteries: Secondary | ICD-10-CM | POA: Diagnosis not present

## 2023-09-07 LAB — CUP PACEART REMOTE DEVICE CHECK
Date Time Interrogation Session: 20250826203314
Implantable Pulse Generator Implant Date: 20250625

## 2023-09-09 ENCOUNTER — Ambulatory Visit: Payer: Self-pay | Admitting: Cardiology

## 2023-09-26 NOTE — Progress Notes (Signed)
 Remote Loop Recorder Transmission

## 2023-10-07 ENCOUNTER — Ambulatory Visit

## 2023-10-10 LAB — CUP PACEART REMOTE DEVICE CHECK
Date Time Interrogation Session: 20250926203312
Implantable Pulse Generator Implant Date: 20250625

## 2023-10-10 MED FILL — Clopidogrel Bisulfate Tab 75 MG (Base Equiv): ORAL | 30 days supply | Qty: 30 | Fill #1 | Status: AC

## 2023-10-11 ENCOUNTER — Other Ambulatory Visit: Payer: Self-pay

## 2023-10-11 ENCOUNTER — Encounter: Payer: Self-pay | Admitting: Emergency Medicine

## 2023-10-11 ENCOUNTER — Emergency Department

## 2023-10-11 ENCOUNTER — Emergency Department: Admission: EM | Admit: 2023-10-11 | Discharge: 2023-10-11 | Disposition: A | Discharge status: Home / Self Care

## 2023-10-11 DIAGNOSIS — E119 Type 2 diabetes mellitus without complications: Secondary | ICD-10-CM | POA: Insufficient documentation

## 2023-10-11 DIAGNOSIS — M25532 Pain in left wrist: Secondary | ICD-10-CM | POA: Insufficient documentation

## 2023-10-11 DIAGNOSIS — Z8505 Personal history of malignant neoplasm of liver: Secondary | ICD-10-CM | POA: Diagnosis not present

## 2023-10-11 DIAGNOSIS — I10 Essential (primary) hypertension: Secondary | ICD-10-CM | POA: Diagnosis not present

## 2023-10-11 MED ORDER — MELOXICAM 15 MG PO TABS
15.0000 mg | ORAL_TABLET | Freq: Every day | ORAL | 0 refills | Status: AC
Start: 2023-10-11 — End: 2023-10-25

## 2023-10-11 NOTE — Discharge Instructions (Signed)
 The x-ray of your wrist did not show any fractures.  I believe your pain is coming from an inflamed tendon.  Please wear the wrist brace for at least a week.  I also recommend following up with orthopedics whose information is attached.  Please take the meloxicam (Mobic) once a day for 2 weeks.  This is an anti-inflammatory.  Do not take other NSAIDs while taking this medication.  NSAIDs include ibuprofen, Motrin, Advil, naproxen, Aleve, celecoxib, and Celebrex.  You can take 650 mg of Tylenol  every 6 hours as needed for pain. You can use ice, heat, muscle creams and other topical pain relievers as well.

## 2023-10-11 NOTE — ED Provider Notes (Signed)
 [facility] Provider Note    Event Date/Time   First MD Initiated Contact with Patient 10/11/23 1335     (approximate)   History   Wrist Pain   HPI  Aaron Cass. is a 73 y.o. male with PMH of hypertension, hepatocellular carcinoma, pituitary adenoma, diabetes who presents for evaluation of left wrist pain.  Patient thinks he injured it pushing himself bed.  States it has been hurting for about 2 weeks.  He has not taken any medication for the pain.  Has tried wrapping his wrist and states that it causes it to be swollen.      Physical Exam   Triage Vital Signs: ED Triage Vitals  Encounter Vitals Group     BP 10/11/23 1216 (!) 150/93     Girls Systolic BP Percentile --      Girls Diastolic BP Percentile --      Boys Systolic BP Percentile --      Boys Diastolic BP Percentile --      Pulse Rate 10/11/23 1216 88     Resp 10/11/23 1216 17     Temp 10/11/23 1216 98 F (36.7 C)     Temp Source 10/11/23 1216 Oral     SpO2 10/11/23 1216 97 %     Weight 10/11/23 1215 201 lb (91.2 kg)     Height 10/11/23 1215 5' 7 (1.702 m)     Head Circumference --      Peak Flow --      Pain Score 10/11/23 1215 9     Pain Loc --      Pain Education --      Exclude from Growth Chart --     Most recent vital signs: Vitals:   10/11/23 1216  BP: (!) 150/93  Pulse: 88  Resp: 17  Temp: 98 F (36.7 C)  SpO2: 97%    General: Awake, no distress.  CV:  Good peripheral perfusion.  Resp:  Normal effort. Abd:  No distention.  Other:  Left hand is mildly swollen when compared with the right, mild tenderness to palpation of the distal radius otherwise nontender, thumb range of motion well-maintained but does elicit some pain, negative Finklestein's test.   ED Results / Procedures / Treatments   Labs (all labs ordered are listed, but only abnormal results are displayed) Labs Reviewed - No data to display   RADIOLOGY Left wrist x-ray obtained, interpreted  the images as well as reviewed the radiologist report which was negative for fractures or other acute abnormalities.  PROCEDURES:  Critical Care performed: No  Procedures   MEDICATIONS ORDERED IN ED: Medications - No data to display   IMPRESSION / MDM / ASSESSMENT AND PLAN / ED COURSE  I reviewed the triage vital signs and the nursing notes.                             73 year old male presents for evaluation of left wrist pain.  Blood pressure is little elevated otherwise vital signs are stable.  Patient does have history of hypertension.  Differential diagnosis includes, but is not limited to, wrist sprain, wrist fracture, carpal tunnel, de Quervain's tenosynovitis.  Patient's presentation is most consistent with acute complicated illness / injury requiring diagnostic workup.  Wrist x-ray is negative for acute abnormalities.  Finklestein's test was negative but patient does appear to be tender and localizes the pain over the extensor pollicis brevis  and abductor pollicis longus tendons.  Will place patient in a thumb spica splint, recommend NSAIDs, ice and Ortho follow-up.  Patient voiced understanding, all questions were answered and he was stable at discharge.       FINAL CLINICAL IMPRESSION(S) / ED DIAGNOSES   Final diagnoses:  Left wrist pain     Rx / DC Orders   ED Discharge Orders          Ordered    meloxicam (MOBIC) 15 MG tablet  Daily        10/11/23 1433             Note:  This document was prepared using Dragon voice recognition software and may include unintentional dictation errors.   Cleaster Tinnie LABOR, PA-C 10/11/23 1435    Clarine Ozell LABOR, MD 10/11/23 [phone]

## 2023-10-11 NOTE — ED Triage Notes (Signed)
 Patient to ED via Pov for left wrist pain. Ongoing x1 week. Thinks he injured it getting out of bed. Swelling noted.

## 2023-10-13 NOTE — Progress Notes (Signed)
 Remote Loop Recorder Transmission

## 2023-11-07 ENCOUNTER — Ambulatory Visit (INDEPENDENT_AMBULATORY_CARE_PROVIDER_SITE_OTHER)

## 2023-11-07 DIAGNOSIS — I639 Cerebral infarction, unspecified: Secondary | ICD-10-CM

## 2023-11-08 LAB — CUP PACEART REMOTE DEVICE CHECK
Date Time Interrogation Session: 20251027203312
Implantable Pulse Generator Implant Date: 20250625

## 2023-11-09 ENCOUNTER — Ambulatory Visit: Payer: Self-pay | Admitting: Cardiology

## 2023-11-11 NOTE — Progress Notes (Signed)
 Discharge Plan Member has been discharged from [facility]. Please see below Discharge Plan for details. The Member has been provided a copy of their Discharge Plan via the [facility] portal. If you have any questions, please contact our Clinical Team at [phone].  Name Aaron Lowery  Date of Birth 05-Nov-1950  Discharge Date: 2023-11-11  Discharged Reason Not Engaged  Discharging to: Referring Specialist  Referring Provider: Dr. Jacobo  Current Psychiatric Medications 1. Mirtazapine  7.5mg  nightly 2. HydrOXYzine HCl Oral Tablet 50 MG 1 po qHS  Psychiatric Medication Recommendation Transition  Date: Nov 11 2023  Client: Aaron Lowery  Gender: Male  Age: 73 DOB: Jun 04, 1950  Referring Specialist consented to prescribe post-discharge  Referrals: No referrals  Discharge Summary Care Delivered: Member attended multiple [facility] and Coach Visits over 1 month.  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 Jacobo @ [facility] for fatigue/insomnia. Aaron Lowery is interested in psychiatric medication recommendations to treat his depressive symptoms.  Assessments: Aaron Lowery scored PHQ-9=9 (mild), reporting daily anhedonia, trouble falling & staying asleep, and hypersomnia. Aaron Lowery scored GAD-7=6 (mild), reporting nervousness, irritability, worries w/ ability to control, restlessness, and feeling afraid.  Psych Tx/Hx: Aaron Lowery is prescribed Trazodone  100mg  qHS and Hydroxyzine 50mg  qHS for sleep, and alternates between these two meds at random w/o notable benefit as of late. He denies hx of  depression/anxiety.  Psychosocial Hx: Aaron Lowery lives at home with his wife Aaron Lowery. 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 Aaron Lowery has 4 adult children, 4 sisters, and 1 late brother. 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

## 2023-11-15 NOTE — Progress Notes (Signed)
 Remote Loop Recorder Transmission

## 2023-12-08 ENCOUNTER — Encounter

## 2023-12-09 ENCOUNTER — Ambulatory Visit: Attending: Cardiology

## 2023-12-09 DIAGNOSIS — I639 Cerebral infarction, unspecified: Secondary | ICD-10-CM | POA: Diagnosis not present

## 2023-12-10 LAB — CUP PACEART REMOTE DEVICE CHECK
Date Time Interrogation Session: 20251127233922
Implantable Pulse Generator Implant Date: 20250625

## 2023-12-12 ENCOUNTER — Ambulatory Visit: Payer: Self-pay | Admitting: Cardiology

## 2023-12-14 NOTE — Progress Notes (Signed)
 Remote Loop Recorder Transmission

## 2023-12-28 NOTE — Progress Notes (Signed)
 " [facility] Evaluation Date: 12/28/2023  [facility] Medical Oncologist Aman Opneja PATIENT IDENTIFICATION  [facility] [place]. is a 73 y.o.  male from [place], [place] with Hepatocellular carcinoma seen today for follow up.    ASSESSMENT & PLAN  A[facility] [place]. is a 73 y.o.  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, Depressive disorder (06/21/2007), Diverticulosis (10/11/2013), Essential hypertension (03/15/2008), Facet arthritis of lumbar region, Glaucoma, open angle (10/06/2012), HCC (hepatocellular carcinoma), Heavy alcohol use (04/26/2013), Hepatitis B, Hepatitis B core antibody positive, History of Chronic hepatitis C without hepatic coma (CMS-HCC) (10/13/2013), Hyperlipidemia (10/11/2013), Hypopituitarism, Hypothyroid (09/21/2023), Osteoarthritis of right knee (02/06/2013), Pituitary macroadenoma, Reflux esophagitis (01/26/2008), Sleep apnea, Spinal stenosis of cervical region, Substance abuse (CMS-HCC), Tobacco use (10/06/2012), and Type 2 diabetes mellitus without complication, without long-term current use of insulin  (CMS/HHS-HCC) (01/08/2020). who presented 09/2022 with CT chest and underwent imaging which showed a liver mass. Liver biopsy was consistent with HCC. He had staging PET CT which showed concern for nodal disease and he was started on treatment with Atezolizumab  plus Bevacizumab  locally ( Dr. Jacobo). He transitioned his care to [facility] He had good response to systemic therapy and underwent resection of liver mass. It was R0 resection and he had extension to diaphragm which required resection at time of surgery. 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   When seen today, he was doing well. Again reviewed chemo logistics and side  effects. DPYD WNL. The capecitabine chemotherapy regimen side effects discussed included: myelosuppression, increased risk of infection, nausea, vomiting, fatigue, alopecia, mucositis, taste changes, decreased appetite, diarrhea, skin changes/photosensitivity, hand/foot syndrome, and coronary vasospasm. The risks of treatment include the rare chance of hospitalization or death. The importance of follow up and communication to prevent these complications was emphasized. He was in agreement and will start therapy.   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   Diabetes- on jardiance but recently ozempic added.  Trying to improve diet and exercise. Joined gym recently  History of microcytic anemia  Iron levels appropriate- continue oral iron supplement B12 low- continue B12 injections with treatments Will continue to monitor  Endocrine: Hypothyroid- Immune mediated, initiated on supplement low dose feb 2025.  Pituitary Macroadenoma diagnosed in 2022 Hypogonadism- patient with concerns about mild erectile dysfunction previously Management per endocrinology   Insomnia- benedryl to start. Influenza vaccination given today.   He will follow up in 3 weeks with labs before the visit   All questions today were answered. Patient/family verbalized understanding of the plan of care and management of side effects. They have our contact information and  know to contact us  with any additional questions or concerns.     Care Team   PCP: [facility] Toribio Sink, MD Medical oncologist: [facility]) Gastroenterologist: Surgical oncologist: Radiation oncologist:  Patient Care Team: [facility] Toribio  Effie, MD as PCP - General (Family Medicine) [facility] Saha, NP as Consulting Provider (Endocrinology)  [place] HISTORY    Cancer Staging <redacted file path>  No matching staging information was found for the patient.  09/24/2022: CT abdomen pelvis - 5.9 cm heterogenous hypodense mass at the dome of the liver, concerning for metastatic disease or primary liver tumor.   10/11/2023: MRI abdomen - 7.0 cm mass in the dome of the right hepatic lobe with extensive  internal heterogeneous and reticular enhancement and a suggestion of an enhancing capsule or pseudocapsule on delayed images. Appearance raises suspicion for primary hepatic malignancy such as  hepatocellular carcinoma. Borderline prominent porta hepatis and left periaortic lymph nodes, but no overt pathologic adenopathy is observed.   10/25/2022: AFO - 38.9, CA 19-9 <2, CEA - 0.9  11/01/2022: PET CT -  Hypermetabolic mass involving the dome of the right hepatic lobe  consistent with biopsy-proven hepatocellular carcinoma. Hypermetabolic soft tissue nodule superior to the left adrenal gland, suspicious for a nodal metastasis. This is adjacent to the  gastric cardia and could reflect a gastrointestinal stromal tumor. Other smaller and less hypermetabolic lymph nodes in the upper abdomen are nonspecific, but could be metastatic.   11/05/2022: Liver biopsy - Hepatocellular carcinoma, moderately differentiated  11/26/2022: Atezolizumab /Bevacizumab  (First line). Has completed 5 cycles.   03/02/2023: Second opinion [facility] 03/03/23: CT CAP improving disease 04/06/23:  Atezo bev at [facility]. Bev held after he had stroke. Complete recovery from stroke.   05/09/2023: Seen at [facility] ER for right-sided numbness and blurry vision. Brain MRI showed scattered posterior circulation infarcts involving the left thalamus and right PCA distribution as above. Symptoms improved spontaneously. EF with no PFO. Neuro recommended dual anti platelet therapy for 90 days followed by Aspirin .  06/13/23 CT CAP stable findings 09/06/23 met with Dr. Zani- will consider surgical resection- continue atezo til then.  09/14/23 CT CAP unchanged HCC, similar to slightly increase mildly enlarged right cardiophrenic and peripancreatic lymph node.   11/17/2023: Robot assisted lap resection of liver and diaphragm resection. Op finding - HCC invading diaphragm. No evidence for metastatic disease.  Pathology -  A. Peri-diaphragm tissue #1, excision, FS A1: Positive for carcinoma. B.  Peri-diaphragm tissue #2, excision, FS B1: Positive for carcinoma. C.  Peri-diaphragm tissue #1, excision, FS C1: Positive for carcinoma. D.  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 All margins negative for tumor.   The patient's disease status is up-to-date as described in the [place] history above.  MOLECULAR DIAGNOSTICS AND MARKERS  Microsatelite unknown  [facility]:   Signatera (ctDNA): Pending  INTERVAL HISTORY  [facility] was seen today for evaluation.   He was feeling well.  Baseline shortness of breath same as before.  Denies nausea, vomiting, abdominal pain, diarrhea, constipation, abdominal distension, blood in stool or dark tarry stools.  Denies fever, chills, chest pain, shortness of breath.  Able to do ADLs comfortably.  No other complaints.   REVIEW OF SYSTEMS  12 point review of systems reviewed and pertinent positives and negatives are as noted in the Interval History.  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 (AHI 16/hr) 2018   Spinal stenosis of cervical region    Substance abuse (CMS-HCC)    Tobacco use 10/06/2012   Type 2 diabetes mellitus without complication, without long-term current use of insulin  (CMS/HHS-HCC) 01/08/2020   Denies DM    Past Surgical History:  Procedure Laterality Date   COLONOSCOPY  08/21/2013   Repeat at least before 74; Procedure: COLORECTAL CANCER SCREENING; COLONOSCOPY ON INDIVIDUAL AT HIGH RISK;  Surgeon: Gladis Victory Mon, MD;  Location: [facility] [place] [facility];  Service: Gastroenterology;;   EGD N/A 04/12/2014   Procedure: EGD;  Surgeon: Inocente Earnie Hausen, MD;  [facility];  Service: Gastroenterology;  Laterality: N/A;   COLONOSCOPY W/REMOVAL LESIONS BY SNARE  12/17/2016   Procedure: COLONOSCOPY, FLEXIBLE; WITH REMOVAL OF TUMOR(S), POLYP(S), OR OTHER LESION(S) BY SNARE TECHNIQUE;  Surgeon: Veerappan, Annapoorani, MD;  Location: [facility] [facility];  Service: Gastroenterology;;   EYE SURGERY Left 11/2019   ECCE   EXTRACTION CATARACT EXTRACAPSULAR W/INSERTION INTRAOCULAR  PROSTHESIS Left 11/19/2019   Procedure: L - EXTRACAPSULAR CATARACT PHACO REMOVAL WITH INSERTION OF INTRAOCULAR LENS PROSTHESIS (1 STAGE PROCEDURE), MANUAL OR MECHANICAL TECHNIQUE; WITHOUT ENDOSCOPIC CYCLOPHOTOCOAGULATION;  Surgeon: Firozvi, Asra Shabana, MD;  Location: [facility] OR;  Service: Ophthalmology;  Laterality: Left;   TRABECULECTOMY AB EXTERNO Left 11/19/2019   Procedure: L - FISTULIZATION OF SCLERA FOR GLAUCOMA; TRABECULECTOMY AB EXTERNO IN ABSENCE OF PREVIOUS SURGERY;  Surgeon: Firozvi, Asra Shabana, MD;  Location: [facility] OR;  Service: Ophthalmology;  Laterality: Left;   EXTRACTION CATARACT EXTRACAPSULAR W/INSERTION INTRAOCULAR PROSTHESIS Right 04/07/2020   Procedure: R - EXTRACAPSULAR CATARACT PHACO REMOVAL WITH INSERTION OF INTRAOCULAR LENS PROSTHESIS (1 STAGE PROCEDURE), MANUAL OR MECHANICAL TECHNIQUE; WITHOUT ENDOSCOPIC CYCLOPHOTOCOAGULATION;  Surgeon: Firozvi, Asra Shabana, MD;  Location: [facility] OR;  Service: Ophthalmology;  Laterality: Right;   TRABECULECTOMY AB EXTERNO Right 09/23/2021   Procedure: R-FISTULIZATION OF SCLERA FOR GLAUCOMA; TRABECULECTOMY AB EXTERNO IN ABSENCE OF PREVIOUS SURGERY;  Surgeon: Firozvi, Asra Shabana, MD;  Location: [facility] OR;  Service: Ophthalmology;  Laterality: Right;   ROBOT ASSISTED LAPAROSCOPIC RESECTION OF LIVER N/A 11/17/2023   Procedure: *ROBOT* ASSISTED LAPAROSCOPY,LAPAROSCOPY,SURGICAL; RESECTION,LIVER;  Surgeon: Zani, Sabino Jr., MD;  [facility];  Service: General Surgery;  Laterality: N/A;   LAPAROSCOPY DIAGNOSTIC N/A 11/17/2023   Procedure: LAPAROSCOPY, SURGICAL; ABDOMEN, PERITONEUM, AND OMENTUM, DIAGNOSTIC, WITH OR WITHOUT COLLECTION OF SPECIMEN(S) BY BRUSHING OR WASHING (SEPARATE PROCEDURE);  Surgeon: Zani, Sabino Jr., MD;  Location: [facility] [facility];  Service: General Surgery;  Laterality: N/A;   RESECTION DIAPHRAGM N/A 11/17/2023   Procedure: RESECTION, DIAPHRAGM; WITH SIMPLE REPAIR (EG, PRIMARY SUTURE);  Surgeon: Zani, Sabino Jr., MD;   Location: [facility] [place] [facility];  Service: General Surgery;  Laterality: N/A;   BIOPSY BRAIN STEREOTACTIC IMAGE GUIDED     at age 31 yr had a brain tumor removed; benign   BRAIN SURGERY     GLAUCOMA  EYE SURGERY     KNEE ARTHROSCOPY Right    TONSILLECTOMY      Family History  Problem Relation Age of Onset   Diabetes type II Mother    High blood pressure (Hypertension) Mother    Diabetes type II Father    High blood pressure (Hypertension) Father    Stroke Father    Cirrhosis Brother    Liver disease Brother    Diabetes type II Sister    Cataracts Paternal Grandmother    Diabetes type II Sister    Glaucoma Neg Hx    Macular degeneration Neg Hx    Anesthesia problems Neg Hx    Malignant hypertension Neg Hx     Social History   Socioeconomic History   Marital status: Married   Number of children: 4   Years of education: 14   Highest education level: Some college, no degree  Occupational History   Occupation: Maintenance man   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 DUI 2015   Drug use: Not Currently    Types: Crack cocaine, Cocaine, Heroin, Marijuana    Comment: History of crack cocaine use, IV heroin, marijuana, snorted cocaine.  Last crack cocaine use was 2014.   Sexual activity: Defer    Partners: Female  Social History Narrative   1/17 - Has attended various classes - information with  computer skills, English courses. Wife has pre-dementia      He lives in [place], [place]. He lives with his wife and two pets. No pets.   Currently he is retired.    Social Drivers of Health   Financial Resource Strain: Medium Risk (11/19/2023)   Overall Financial Resource Strain (CARDIA)    Difficulty of Paying Living Expenses: Somewhat hard  Food Insecurity: Food Insecurity Present (11/19/2023)   Hunger Vital Sign    Worried About Running Out of Food in the Last Year: Sometimes true    Ran Out of Food in the Last Year: Sometimes true  Transportation Needs: No Transportation Needs (11/25/2023)   PRAPARE - Administrator, Civil Service (Medical): No    Lack of Transportation (Non-Medical): No  Physical Activity: Insufficiently Active (01/27/2021)   Exercise Vital Sign    Days of Exercise per Week: 3 days    Minutes of Exercise per Session: 30 min  Stress: Stress Concern Present (01/27/2021)   [employer] of Occupational Health - Occupational Stress Questionnaire    Feeling of Stress : To some extent  Social Connections: Socially Integrated (05/09/2023)   Received from [facility] [place]   Social Connection and Isolation Panel    In a typical week, how many times do you talk on the phone with family, friends, or neighbors?: More than three times a week    How often do you get together with  friends or relatives?: Once a week    How often do you attend church or religious services?: More than 4 times per year    Do you belong to any clubs or organizations such as church groups, unions, fraternal or athletic groups, or school groups?: Yes    How often do you attend meetings of the clubs or organizations you belong to?: More than 4 times per year    Are you married, widowed, divorced, separated, never married, or living with a partner?: Married  Housing Stability: Low Risk  (11/19/2023)   Housing Stability Vital Sign    Unable to Pay for Housing in the Last Year: No     Number of Times Moved in the Last Year: 0    Homeless in the Last Year: No    No Known Allergies  Outpatient Encounter Medications as of 12/28/2023  Medication Sig Dispense Refill   aloe vera, bulk, Oil      ascorbic acid (VITAMIN C) 500 MG tablet Take 500 mg by mouth once daily as needed     aspirin  81 MG EC tablet Take 81 mg by mouth once daily     atorvastatin  (LIPITOR) 40 MG tablet Take 1 tablet (40 mg total) by mouth once daily 90 tablet 3   brimonidine (ALPHAGAN) 0.2 % ophthalmic solution PLACE 1 DROP INTO THE LEFT EYE 3 (THREE) TIMES DAILY FOR 90 DAYS (Patient taking differently: Place 1 drop into the left eye 2 (two) times daily) 15 mL 3   brimonidine (ALPHAGAN) 0.2 % ophthalmic solution Place 1 drop into both eyes 2 (two) times daily 15 mL 3   cholecalciferol, vitamin D3, (VITAMIN D3 ORAL) Take 1 tablet by mouth once daily     clopidogreL  (PLAVIX ) 75 mg tablet Take 75 mg by mouth once daily     cyanocobalamin (VITAMIN B12) 1,000 mcg/mL injection Inject into the muscle every 21 (twenty-one) days     dorzolamide-timoloL (COSOPT) 22.3-6.8 mg/mL ophthalmic solution PLACE 1 DROP INTO THE LEFT EYE 3 (THREE) TIMES DAILY FOR 90 DAYS (Patient taking differently: Place 1 drop into the left eye 2 (two) times daily) 30 mL 1   dorzolamide-timoloL (COSOPT) 22.3-6.8 mg/mL ophthalmic solution Place 1 drop into both eyes 2 (two) times daily for 90 days 15 mL 3   escitalopram oxalate (LEXAPRO) 10 MG tablet Take 1 tablet (10 mg total) by mouth once daily 30 tablet 2   ferrous sulfate  325 (65 FE) MG tablet Take 1 tablet (325 mg total) by mouth daily with breakfast 30 tablet 11   gabapentin (NEURONTIN) 100 MG capsule Take 1 capsule (100 mg total) by mouth 3 (three) times daily for 5 days 15 capsule 0   lancets once daily Use as instructed. 90 each 3   losartan (COZAAR) 50 MG tablet TAKE 1 TABLET BY MOUTH TWICE A DAY 180 tablet 3   multivit-min/folic/vit K/lycop (MEN'S MULTIVITAMIN ORAL)  Take 1 tablet by mouth once daily     oxyCODONE (ROXICODONE) 5 MG immediate release tablet Take 1 tablet (5 mg total) by mouth every 6 (six) hours as needed for up to 15 doses 15 tablet 0   testosterone (ANDROGEL) 20.25 mg/1.25 gram (1.62 %) gel in metered dose pump Apply 2 Pump (40.5 mg of testosterone total) topically once daily for 180 days Apply daily to shoulders and/or upper arms only. 75 g 2   travoprost (TRAVATAN Z) 0.004 % Ophth ophthalmic solution PLACE 1 DROP INTO BOTH EYES AT BEDTIME FOR 90 DAYS 7.5  mL 3   travoprost (TRAVATAN Z) 0.004 % Ophth ophthalmic solution Place 1 drop into both eyes at bedtime for 90 days 7.5 mL 3   traZODone  (DESYREL ) 100 MG tablet Take 1 tablet (100 mg total) by mouth at bedtime 30 tablet 2   traZODone  (DESYREL ) 50 MG tablet Take 1 tablet by mouth at bedtime as needed for Sleep     vitamin E 400 UNIT capsule Take 400 Units by mouth once daily     ZINC ORAL Take 1 tablet by mouth once daily as needed     ACCU-CHEK GUIDE TEST STRIPS test strip USE 1 STRIP 3 TIMES A DAY AS INSTRUCTED 100 strip 5   capecitabine (XELODA) 500 MG tablet Take 3 tablets of 500 mg twice a day for 14 days followed by 7 days off. (Patient not taking: Reported on 12/15/2023) 84 tablet 7   [Paused] empagliflozin (JARDIANCE) 25 mg tablet Take 1 tablet (25 mg total) by mouth once daily (Patient not taking: Reported on 12/28/2023) 90 tablet 3   levothyroxine  (SYNTHROID ) 75 MCG tablet Take 1 tablet (75 mcg total) by mouth once daily Take on an empty stomach with a glass of water at least 30-60 minutes before breakfast. (Patient not taking: Reported on 12/28/2023) 30 tablet 11   ondansetron  (ZOFRAN ) 8 MG tablet Take 1 tablet (8 mg total) by mouth every 8 (eight) hours as needed for Nausea or Vomiting (Post chemotherapy nausea/vomiting) for up to 30 days 60 tablet 3   sildenafiL (VIAGRA) 50 MG tablet Take 1 tablet (50 mg total) by mouth once daily as needed for Erectile Dysfunction (do  not take more that 100 mg in 24 hours) for up to 30 days (Patient not taking: Reported on 11/30/2023) 30 tablet 11   sildenafiL (VIAGRA) 50 MG tablet Take 50 mg by mouth once daily as needed for Erectile Dysfunction (Patient not taking: Reported on 12/28/2023)     tamsulosin (FLOMAX) 0.4 mg capsule Take 1 capsule (0.4 mg total) by mouth once daily Take 30 minutes after same meal each day. (Patient not taking: Reported on 12/15/2023) 30 capsule 11   [Paused] tirzepatide (MOUNJARO) 5 mg/0.5 mL pen injector Inject 0.5 mLs (5 mg total) subcutaneously once a week (Patient not taking: Reported on 12/13/2023) 2 mL 5   Facility-Administered Encounter Medications as of 12/28/2023  Medication Dose Route Frequency Provider Last Rate Last Admin   cosyntropin (CORTROSYN) injection 0.25 mg  0.25 mg Intramuscular As Directed Shahla, Leena, MD        PHYSICAL EXAMINATION   BP 116/62 (BP Location: Left upper arm, Patient Position: Sitting, BP Cuff Size: Adult)   Pulse 75   Temp 36.3 C (97.3 F) (Oral)   Resp 18   Ht 167.6 cm (5' 5.98)   Wt 92.9 kg (204 lb 12.9 oz)   SpO2 95%   BMI 33.07 kg/m   Wt Readings from Last 3 Encounters:  12/28/23 92.9 kg (204 lb 12.9 oz)  12/15/23 90.7 kg (200 lb)  12/14/23 92.1 kg (203 lb 0.7 oz)    ECOG: 1 Strenuous physical activity restricted; fully ambulatory and able to carry out light work  Constitutional: NAD, well appearing HEENT: pupils round, EOMI, sclera clear, external ear inspection normal Oropharynx: deferred due to COVID precautions, wearing a mask Neck: supple without visible cervical adenopathy Cardiovascular: regular rate and rhythm and without murmurs, rubs or gallops Respiratory: clear to auscultation bilaterally Abdomen: soft, nontender, nondistended, normal bowel sounds Extremities: no lower extremity edema Skin: normal  coloration and turgor, no rashes or suspicious skin lesions  Neurological: AAO x 4, normal speech, grossly normal motor and  sensory exam, normal muscle tone, no tremors  LABS AND STUDIES   Results for orders placed or performed in visit on 12/28/23  Adrenocorticotropic Hormone (ACTH): 0; Venipuncture  Result Value Ref Range   Adrenocorticotropic Hormone (ACTH) 24 15 - 66 pg/mL   TIME INTERVAL   0 minutes   Narrative      Reference range valid for 7:00 - 10:00 AM sample.    Cortisol: 0; Venipuncture  Result Value Ref Range   Cortisol 11.0 5.0 - 25.0 g/dL   TIME INTERVAL   0 minutes   Narrative    Interpretive Data                    The reference range above applies to an AM draw sample. There is               a diurnal variation in cortisol secretion. The level at 8:00pm is              normally 50% the level at 8:00am. Loss of diurnal variation is                 often seen in Cushing's Syndrome.        Dehydroepiandrosterone (DHEA)-Sulfate  Result Value Ref Range   DHEA (Dehydroepiandrosterone) -Sulfate 48 (L) 80 - 560 g/dL  Thyroid  Stimulating Hormone (TSH)  Result Value Ref Range   Thyroid  Stimulating Hormone (TSH) 0.47 0.34 - 5.66 IU/mL  Thyroxine (T4), Free  Result Value Ref Range   Thyroxine, Free (FT4) 0.75 0.52 - 1.21 ng/dL  Complete Blood Count (CBC) with Differential  Result Value Ref Range   WBC (White Blood Cell Count) 6.5 3.2 - 9.8 x109/L   Hemoglobin 10.7 (L) 13.7 - 17.3 g/dL   Hematocrit 63.8 (L) 60.9 - 49.0 %   Platelets 233 150 - 450 x109/L   MCV (Mean Corpuscular Volume) 74 (L) 80 - 98 fL   MCH (Mean Corpuscular Hemoglobin) 21.9 (L) 26.5 - 34.0 pg   MCHC (Mean Corpuscular Hemoglobin Concentration) 29.6 (L) 31.5 - 36.3 %   RBC (Red Blood Cell Count) 4.88 4.37 - 5.74 x1012/L   RDW-CV (Red Cell Distribution Width) 17.2 (H) 11.5 - 14.5 %   NRBC (Nucleated Red Blood Cell Count) 0.00 0 x109/L   NRBC % (Nucleated Red Blood Cell %) 0.0 %   MPV (Mean Platelet Volume) 10.3 7.2 - 11.7 fL   Neutrophil Count 4.2 2.0 - 8.6 x109/L   Neutrophil % 64.2 37 - 80 %   Lymphocyte  Count 1.5 0.6 - 4.2 x109/L   Lymphocyte % 22.6 10 - 50 %   Monocyte Count 0.4 0 - 0.9 x109/L   Monocyte % 6.5 0 - 12 %   Eosinophil Count 0.39 0 - 0.70 x109/L   Eosinophil % 6.0 0 - 7 %   Basophil Count 0.01 0 - 0.20 x109/L   Basophil % 0.2 0 - 2 %   Immature Granulocyte Count 0.03 <=0.06 x109/L   Immature Granulocyte % 0.5 <=0.7 %  Comprehensive Metabolic Panel (CMP)  Result Value Ref Range   Sodium 141 136 - 145 mmol/L   Potassium 3.9 3.5 - 5.0 mmol/L   Chloride 108 (H) 98 - 107 mmol/L   Carbon Dioxide (CO2) 28 21 - 31 mmol/L   Urea Nitrogen (BUN) 17 6 - 20 mg/dL  Creatinine 1.0 0.7 - 1.3 mg/dL   Glucose 843 (H) 70 - 140 mg/dL   Calcium  9.2 8.6 - 10.3 mg/dL   AST (Aspartate Aminotransferase) 15 13 - 39 U/L   ALT (Alanine Aminotransferase) 13 7 - 52 U/L   Bilirubin, Total 0.4 0.3 - 1.0 mg/dL   Alk Phos (Alkaline Phosphatase) 96 34 - 104 U/L   Albumin 4.1 3.5 - 5.2 g/dL   Protein, Total 7.8 6.0 - 8.3 g/dL   Anion Gap 5 3 - 12 mmol/L   BUN/CREA Ratio 17 6 - 27   Glomerular Filtration Rate (eGFR)  79 mL/min/1.73sq m   *Note: Due to a large number of results and/or encounters for the requested time period, some results have not been displayed. A complete set of results can be found in Results Review.   Results for orders placed during the [facility] encounter of 11/17/23  CT CHEST ABDOMEN PELVIS WITH CONTRAST W MIPS  Narrative Procedure: CT Chest with IV Contrast Procedure: CT Abdomen and Pelvis with IV Contrast  Comparison:  09/14/2023 CT chest abdomen pelvis.  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  Iodinated contrast was used due to the indications for the examination, to improve  disease detection and to further define anatomy.  3-D maximal intensity projection (MIP) reconstructions of the chest were created and reviewed to potentially increase study sensitivity. Coronal and sagittal images were also generated and reviewed.  Findings:  Motion degraded examination.  Chest: - Chest wall and Thoracic Inlet: No axillary or supraclavicular lymphadenopathy.  - Mediastinum and Hila: New right internal mammary lymphadenopathy measuring up to 1.0 cm (3:32), previously 0.7 cm. Redemonstrated right cardiophrenic lymphadenopathy measuring up to 1.1 cm (3:68), previously 1.0 cm.  - Thoracic Vessels: Normal caliber of the thoracic aorta and main pulmonary artery.  - Heart and Pericardium: No pericardial effusion.  - Lungs and Airways: Right lung base atelectasis.  - Pleura: New small right pleural effusion.   Abdomen and pelvis:  - Liver: Status post interval partial right hepatectomy. Small subdiaphragmatic air and fluid, with right lateral approach surgical drain in place. High density material along the diaphragm, likely post-surgical. No suspicious hepatic masses are identified.  The portal and hepatic veins are patent.  - Biliary and Gallbladder: No intrahepatic or extrahepatic bile duct dilatation. The gallbladder is unremarkable.  - Spleen: Normal in appearance.  - Pancreas: Normal in appearance.  - Adrenal Glands: Normal in appearance.  - Kidneys: Symmetric in size and enhancement. No suspicious renal lesions. Left renal cyst. No hydronephrosis.  - Abdominal and Pelvic Vasculature: No abdominal aortic aneurysm.  - Gastrointestinal Tract: No abnormal dilation or wall thickening.  - Peritoneum/Mesentery/Retroperitoneum: No free fluid.  No free intraperitoneal air.  - Lymph Nodes: No retroperitoneal or mesenteric lymphadenopathy.  - Bladder: Normal in appearance.  - Pelvic Organs: Unremarkable.  - Body Wall: Small amount of postoperative  air in the right upper quadrant chest wall. Right lateral abdominal wall edema.  - Musculoskeletal:  No aggressive appearing osseous lesions.   Impression: 1.  Status post partial right hepatectomy. Small amount of subdiaphragmatic air and fluid with a surgical drain in place. 2.  New right pleural effusion with associated basilar atelectasis. 3.  Slightly increased right internal mammary and right cardiophrenic lymphadenopathy,  possibly reactive. Stable peripancreatic lymphadenopathy.  Electronically Reviewed by:  Hezzie Bellingham, MD, [facility] Electronically Reviewed on:  11/21/2023 2:56 PM  I have reviewed the images and concur with the above findings.  Electronically Signed by:  Olam Rouse, MD, [facility] Electronically Signed on:  11/21/2023 5:07 PM       FUTURE APPOINTMENTS   Future Appointments     Date/Time Provider Department [facility] Visit Type   02/29/2024 1:15 PM Lane Arthea Locus, MD [facility] RETURN VISIT   03/20/2024 1:50 PM (Arrive by 1:35 PM) [facility] LAB   03/20/2024 3:00 PM (Arrive by 2:40 PM) CC CT [facility]  CT [facility] Ctr CT CIRRHOSIS CHEST W ABD PEL W   03/20/2024 3:30 PM (Arrive by 3:15 PM) Travis Kung, PA [facility] RETURN VISIT   03/21/2024 11:00 AM (Arrive by 10:45 AM) everitt Ram, Berwyn Like, NP [facility] [place] [facility] VIDEO VISIT RETURN ADULT   04/11/2024 2:15 PM (Arrive by 2:00 PM) Eloy Petrina Maudlin, MD; [facility] [place]  [facility] RETURN VISIT   05/21/2024 1:00 PM (Arrive by 12:30 PM) Nola Tobias Brunt, MD [facility]   10/31/2024 8:30 AM (Arrive by 8:00 AM) CC MR 3 [facility] MRI BRAIN [facility]   10/31/2024 10:30 AM (Arrive by 10:15 AM) Deatrice Belvie Agent, MD [facility] RETURN VISIT        "

## 2024-01-04 ENCOUNTER — Telehealth: Payer: Self-pay

## 2024-01-04 NOTE — Telephone Encounter (Signed)
 Clinical Social Work attempted to contact patient by phone after his discharge from [facility].  Left voicemail with contact information and request for a return call.

## 2024-01-08 ENCOUNTER — Encounter

## 2024-01-09 ENCOUNTER — Ambulatory Visit: Attending: Cardiology

## 2024-01-09 DIAGNOSIS — I639 Cerebral infarction, unspecified: Secondary | ICD-10-CM | POA: Diagnosis not present

## 2024-01-10 ENCOUNTER — Telehealth: Payer: Self-pay

## 2024-01-10 LAB — CUP PACEART REMOTE DEVICE CHECK
Date Time Interrogation Session: 20251228234924
Implantable Pulse Generator Implant Date: 20250625

## 2024-01-10 NOTE — Telephone Encounter (Signed)
 Clinical Social Work was referred by [facility] after discharge for assessment of psychosocial needs.  CSW attempted to contact patient by phone a second time.  Left voicemail with contact information and request for return call.

## 2024-01-14 ENCOUNTER — Ambulatory Visit: Payer: Self-pay | Admitting: Cardiology

## 2024-01-18 NOTE — Progress Notes (Signed)
 Remote Loop Recorder Transmission

## 2024-02-08 ENCOUNTER — Encounter

## 2024-02-09 ENCOUNTER — Ambulatory Visit: Attending: Cardiology

## 2024-02-09 DIAGNOSIS — I639 Cerebral infarction, unspecified: Secondary | ICD-10-CM

## 2024-02-09 LAB — CUP PACEART REMOTE DEVICE CHECK
Date Time Interrogation Session: 20260128233409
Implantable Pulse Generator Implant Date: 20250625

## 2024-02-10 ENCOUNTER — Telehealth: Payer: Self-pay | Admitting: Cardiology

## 2024-02-16 NOTE — Progress Notes (Signed)
 Remote Loop Recorder Transmission

## 2024-03-10 ENCOUNTER — Encounter

## 2024-03-11 ENCOUNTER — Ambulatory Visit
